# Patient Record
Sex: Male | Born: 1965 | Race: Black or African American | Hispanic: No | Marital: Married | Smoking: Never smoker
Health system: Southern US, Community
[De-identification: ages and names within clinical notes are randomized; demographics above are authoritative.]

## PROBLEM LIST (undated history)

## (undated) DIAGNOSIS — R0602 Shortness of breath: Secondary | ICD-10-CM

## (undated) DIAGNOSIS — J302 Other seasonal allergic rhinitis: Secondary | ICD-10-CM

## (undated) DIAGNOSIS — M1990 Unspecified osteoarthritis, unspecified site: Secondary | ICD-10-CM

## (undated) DIAGNOSIS — I499 Cardiac arrhythmia, unspecified: Secondary | ICD-10-CM

## (undated) DIAGNOSIS — K219 Gastro-esophageal reflux disease without esophagitis: Secondary | ICD-10-CM

## (undated) HISTORY — PX: KNEE ARTHROSCOPY: SUR90

## (undated) HISTORY — PX: LIGAMENT REPAIR: SHX5444

## (undated) HISTORY — PX: COLONOSCOPY W/ POLYPECTOMY: SHX1380

---

## 2010-03-23 HISTORY — PX: LUMBAR LAMINECTOMY: SHX95

## 2013-08-18 ENCOUNTER — Other Ambulatory Visit: Payer: Self-pay | Admitting: Neurological Surgery

## 2013-09-05 ENCOUNTER — Encounter (HOSPITAL_COMMUNITY): Payer: Self-pay | Admitting: Pharmacy Technician

## 2013-09-06 NOTE — Pre-Procedure Instructions (Signed)
Terry CouncilmanBarry Steele  09/06/2013   Your procedure is scheduled on:  Monday, June 29  [facility] at 0[facility] AM.  Call this number if you have problems the morning of surgery: [phone]   Remember:   Do not eat food or drink liquids after midnight.Sunday night   Take these medicines the morning of surgery with A SIP OF WATER: gabapentin, Omeprazole (Prilosec),Pain medication if needed.   Do not wear jewelry.  Do not wear lotions, powders, or perfumes. Do not wear deodorant.  Do not shave 48 hours prior to surgery. Men may shave face and neck.  Do not bring valuables to the hospital.  [facility] is not responsible for any belongings or valuables.               Contacts, dentures or bridgework may not be worn into surgery.  Leave suitcase in the car. After surgery it may be brought to your room.  For patients admitted to the hospital, discharge time is determined by your   treatment team.      Special Instructions: [facility] - Preparing for Surgery  Before surgery, you can play an important role.  Because skin is not sterile, your skin needs to be as free of germs as possible.  You can reduce the number of germs on you skin by washing with CHG (chlorahexidine gluconate) soap before surgery.  CHG is an antiseptic cleaner which kills germs and bonds with the skin to continue killing germs even after washing.  Please DO NOT use if you have an allergy to CHG or antibacterial soaps.  If your skin becomes reddened/irritated stop using the CHG and inform your nurse when you arrive at Short Stay.  Do not shave (including legs and underarms) for at least 48 hours prior to the first CHG shower.  You may shave your face.  Please follow these instructions carefully:   1.  Shower with CHG Soap the night before surgery and the    morning of Surgery.  2.  If you choose to wash your hair, wash your hair first as usual with your   normal shampoo.  3.  After you shampoo, rinse  your hair and body thoroughly to remove the Shampoo.  4.  Use CHG as you would any other liquid soap.  You can apply chg directly    to the skin and wash gently with scrungie or a clean washcloth.  5.  Apply the CHG Soap to your body ONLY FROM THE NECK DOWN.    Do not use on open wounds or open sores.  Avoid contact with your eyes,   ears, mouth and genitals (private parts).  Wash genitals (private parts)    with your normal soap.  6.  Wash thoroughly, paying special attention to the area where your surgery   will be performed.  7.  Thoroughly rinse your body with warm water from the neck down.  8.  DO NOT shower/wash with your normal soap after using and rinsing off  the CHG Soap.  9.  Pat yourself dry with a clean towel.            10.  Wear clean pajamas.            11 .  Place clean sheets on your bed the night of your first shower and do not   sleep with pets.  Day of Surgery  Do not apply any lotions/deoderants the morning of surgery.  Please  wear clean clothes to the hospital/surgery center.     Please read over the following fact sheets that you were given: Pain Booklet, Coughing and Deep Breathing, Blood Transfusion Information and Surgical Site Infection Prevention

## 2013-09-07 ENCOUNTER — Encounter (HOSPITAL_COMMUNITY): Payer: Self-pay

## 2013-09-07 ENCOUNTER — Encounter (HOSPITAL_COMMUNITY)
Admission: RE | Admit: 2013-09-07 | Discharge: 2013-09-07 | Disposition: A | Discharge status: Home / Self Care | Payer: BC Managed Care – PPO | Source: Ambulatory Visit | Attending: Neurological Surgery | Admitting: Neurological Surgery

## 2013-09-07 DIAGNOSIS — Z01812 Encounter for preprocedural laboratory examination: Secondary | ICD-10-CM | POA: Insufficient documentation

## 2013-09-07 HISTORY — DX: Other seasonal allergic rhinitis: J30.2

## 2013-09-07 HISTORY — DX: Shortness of breath: R06.02

## 2013-09-07 HISTORY — DX: Cardiac arrhythmia, unspecified: I49.9

## 2013-09-07 HISTORY — DX: Gastro-esophageal reflux disease without esophagitis: K21.9

## 2013-09-07 HISTORY — DX: Unspecified osteoarthritis, unspecified site: M19.90

## 2013-09-07 LAB — BASIC METABOLIC PANEL
BUN: 11 mg/dL (ref 6–23)
CHLORIDE: 100 meq/L (ref 96–112)
CO2: 29 meq/L (ref 19–32)
CREATININE: 1.15 mg/dL (ref 0.50–1.35)
Calcium: 10 mg/dL (ref 8.4–10.5)
GFR calc non Af Amer: 73 mL/min — ABNORMAL LOW (ref >90)
GFR, EST AFRICAN AMERICAN: 85 mL/min — AB (ref >90)
Glucose, Bld: 88 mg/dL (ref 70–99)
POTASSIUM: 4.7 meq/L (ref 3.7–5.3)
SODIUM: 140 meq/L (ref 137–147)

## 2013-09-07 LAB — CBC
HEMATOCRIT: 45.1 % (ref 39.0–52.0)
Hemoglobin: 15.7 g/dL (ref 13.0–17.0)
MCH: 32.6 pg (ref 26.0–34.0)
MCHC: 34.8 g/dL (ref 30.0–36.0)
MCV: 93.6 fL (ref 78.0–100.0)
Platelets: 239 10*3/uL (ref 150–400)
RBC: 4.82 MIL/uL (ref 4.22–5.81)
RDW: 11.7 % (ref 11.5–15.5)
WBC: 5 10*3/uL (ref 4.0–10.5)

## 2013-09-07 LAB — TYPE AND SCREEN
ABO/RH(D): O POS
ANTIBODY SCREEN: NEGATIVE

## 2013-09-07 LAB — SURGICAL PCR SCREEN
MRSA, PCR: NEGATIVE
STAPHYLOCOCCUS AUREUS: NEGATIVE

## 2013-09-07 LAB — ABO/RH: ABO/RH(D): O POS

## 2013-09-07 NOTE — Progress Notes (Signed)
Patient's PCP is Dr Ralene Okoy Moreira, does not see a cardiologist, denies chest pain or SOB.

## 2013-09-07 NOTE — Pre-Procedure Instructions (Signed)
Terry CouncilmanBarry Steele  09/07/2013   Your procedure is scheduled on:  Monday, June 29  [facility] at 0800 AM.  Call this number if you have problems the morning of surgery: [phone]   Remember:   Do not eat food or drink liquids after midnight.Sunday night   Take these medicines the morning of surgery with A SIP OF WATER: gabapentin, Omeprazole (Prilosec),Pain medication if needed.             Use Albuterol inhaler if needed and bring it to the hospital with you.             Stop taking Aspirin, Coumadin, Plavix, Effient and Herbal medications.  Do not take any NSAIDs ie: Ibuprofen,  Advil,Naproxen or any medication containing Aspirin.   Do not wear jewelry.  Do not wear lotions, powders, or perfumes. Do not wear deodorant.  Do not shave 48 hours prior to surgery. Men may shave face and neck.  Do not bring valuables to the hospital.  [facility] is not responsible for any belongings or valuables.               Contacts, dentures or bridgework may not be worn into surgery.  Leave suitcase in the car. After surgery it may be brought to your room.  For patients admitted to the hospital, discharge time is determined by your   treatment team.        Please read over the following fact sheets that you were given: Pain Booklet, Coughing and Deep Breathing, Blood Transfusion Information and Surgical Site Infection Prevention

## 2013-09-17 MED ORDER — CEFAZOLIN SODIUM-DEXTROSE 2-3 GM-% IV SOLR
2.0000 g | INTRAVENOUS | Status: AC
  Administered 2013-09-18: 2 g via INTRAVENOUS
  Filled 2013-09-17: qty 50

## 2013-09-18 ENCOUNTER — Encounter (HOSPITAL_COMMUNITY)
Admission: RE | Disposition: A | Discharge status: Home / Self Care | Payer: BC Managed Care – PPO | Source: Ambulatory Visit | Attending: Neurological Surgery

## 2013-09-18 ENCOUNTER — Inpatient Hospital Stay (HOSPITAL_COMMUNITY): Payer: BC Managed Care – PPO | Admitting: Anesthesiology

## 2013-09-18 ENCOUNTER — Encounter (HOSPITAL_COMMUNITY): Payer: BC Managed Care – PPO | Admitting: Anesthesiology

## 2013-09-18 ENCOUNTER — Inpatient Hospital Stay (HOSPITAL_COMMUNITY)
Admission: RE | Admit: 2013-09-18 | Discharge: 2013-09-20 | DRG: 460 | Disposition: A | Discharge status: Home / Self Care | Payer: BC Managed Care – PPO | Source: Ambulatory Visit | Attending: Neurological Surgery | Admitting: Neurological Surgery

## 2013-09-18 ENCOUNTER — Inpatient Hospital Stay (HOSPITAL_COMMUNITY): Payer: BC Managed Care – PPO

## 2013-09-18 ENCOUNTER — Encounter (HOSPITAL_COMMUNITY): Payer: Self-pay | Admitting: *Deleted

## 2013-09-18 DIAGNOSIS — K219 Gastro-esophageal reflux disease without esophagitis: Secondary | ICD-10-CM | POA: Diagnosis present

## 2013-09-18 DIAGNOSIS — IMO0002 Reserved for concepts with insufficient information to code with codable children: Principal | ICD-10-CM | POA: Diagnosis present

## 2013-09-18 DIAGNOSIS — M48061 Spinal stenosis, lumbar region without neurogenic claudication: Secondary | ICD-10-CM | POA: Diagnosis present

## 2013-09-18 DIAGNOSIS — Z79899 Other long term (current) drug therapy: Secondary | ICD-10-CM

## 2013-09-18 SURGERY — POSTERIOR LUMBAR FUSION 1 LEVEL
Anesthesia: General | Site: Spine Lumbar

## 2013-09-18 MED ORDER — OXYCODONE HCL 5 MG PO TABS
10.0000 mg | ORAL_TABLET | ORAL | Status: DC | PRN
Start: 2013-09-18 — End: 2013-09-20

## 2013-09-18 MED ORDER — KETOROLAC TROMETHAMINE 30 MG/ML IJ SOLN
INTRAMUSCULAR | Status: AC
  Filled 2013-09-18: qty 1

## 2013-09-18 MED ORDER — LIDOCAINE HCL (CARDIAC) 20 MG/ML IV SOLN
INTRAVENOUS | Status: DC | PRN
  Administered 2013-09-18: 100 mg via INTRAVENOUS

## 2013-09-18 MED ORDER — PHENYLEPHRINE HCL 10 MG/ML IJ SOLN
INTRAMUSCULAR | Status: DC | PRN
Start: 2013-09-18 — End: 2013-09-18
  Administered 2013-09-18 (×3): 80 ug via INTRAVENOUS

## 2013-09-18 MED ORDER — PHENYLEPHRINE 40 MCG/ML (10ML) SYRINGE FOR IV PUSH (FOR BLOOD PRESSURE SUPPORT)
PREFILLED_SYRINGE | INTRAVENOUS | Status: AC
  Filled 2013-09-18: qty 10

## 2013-09-18 MED ORDER — GLYCOPYRROLATE 0.2 MG/ML IJ SOLN
INTRAMUSCULAR | Status: DC | PRN
  Administered 2013-09-18: 0.6 mg via INTRAVENOUS

## 2013-09-18 MED ORDER — NEOSTIGMINE METHYLSULFATE 10 MG/10ML IV SOLN
INTRAVENOUS | Status: AC
  Filled 2013-09-18: qty 1

## 2013-09-18 MED ORDER — EPHEDRINE SULFATE 50 MG/ML IJ SOLN
INTRAMUSCULAR | Status: DC | PRN
  Administered 2013-09-18: 10 mg via INTRAVENOUS

## 2013-09-18 MED ORDER — LIDOCAINE-EPINEPHRINE 1 %-1:100000 IJ SOLN
INTRAMUSCULAR | Status: DC | PRN
  Administered 2013-09-18: 5 mL

## 2013-09-18 MED ORDER — GABAPENTIN 300 MG PO CAPS
300.0000 mg | ORAL_CAPSULE | Freq: Three times a day (TID) | ORAL | Status: DC | PRN
  Filled 2013-09-18: qty 1

## 2013-09-18 MED ORDER — NEOSTIGMINE METHYLSULFATE 10 MG/10ML IV SOLN
INTRAVENOUS | Status: DC | PRN
  Administered 2013-09-18: 4 mg via INTRAVENOUS

## 2013-09-18 MED ORDER — METHOCARBAMOL 500 MG PO TABS
500.0000 mg | ORAL_TABLET | Freq: Four times a day (QID) | ORAL | Status: DC | PRN
  Administered 2013-09-18 – 2013-09-20 (×7): 500 mg via ORAL
  Filled 2013-09-18 (×6): qty 1

## 2013-09-18 MED ORDER — SODIUM CHLORIDE 0.9 % IV SOLN
INTRAVENOUS | Status: DC | PRN
Start: 2013-09-18 — End: 2013-09-18
  Administered 2013-09-18: 11:00:00 via INTRAVENOUS

## 2013-09-18 MED ORDER — ALBUTEROL SULFATE HFA 108 (90 BASE) MCG/ACT IN AERS: 1.0000 | INHALATION_SPRAY | Freq: Four times a day (QID) | RESPIRATORY_TRACT | Status: DC | PRN

## 2013-09-18 MED ORDER — ONDANSETRON HCL 4 MG/2ML IJ SOLN
INTRAMUSCULAR | Status: DC | PRN
  Administered 2013-09-18: 4 mg via INTRAVENOUS

## 2013-09-18 MED ORDER — METHOCARBAMOL 500 MG PO TABS
ORAL_TABLET | ORAL | Status: AC
  Filled 2013-09-18: qty 1

## 2013-09-18 MED ORDER — OXYCODONE-ACETAMINOPHEN 5-325 MG PO TABS
1.0000 | ORAL_TABLET | ORAL | Status: DC | PRN
Start: 2013-09-18 — End: 2013-09-20
  Administered 2013-09-18 – 2013-09-20 (×10): 2 via ORAL
  Filled 2013-09-18 (×10): qty 2

## 2013-09-18 MED ORDER — SODIUM CHLORIDE 0.9 % IJ SOLN
3.0000 mL | Freq: Two times a day (BID) | INTRAMUSCULAR | Status: DC
Start: 2013-09-18 — End: 2013-09-20
  Administered 2013-09-18: 3 mL via INTRAVENOUS

## 2013-09-18 MED ORDER — BUPIVACAINE HCL (PF) 0.5 % IJ SOLN
INTRAMUSCULAR | Status: DC | PRN
  Administered 2013-09-18: 5 mL

## 2013-09-18 MED ORDER — BSS IO SOLN: 15.0000 mL | Freq: Once | INTRAOCULAR | Status: DC

## 2013-09-18 MED ORDER — MENTHOL 3 MG MT LOZG: 1.0000 | LOZENGE | OROMUCOSAL | Status: DC | PRN

## 2013-09-18 MED ORDER — THROMBIN 20000 UNITS EX SOLR
CUTANEOUS | Status: DC | PRN
  Administered 2013-09-18: 09:00:00 via TOPICAL

## 2013-09-18 MED ORDER — DEXAMETHASONE SODIUM PHOSPHATE 10 MG/ML IJ SOLN
INTRAMUSCULAR | Status: DC | PRN
  Administered 2013-09-18: 10 mg via INTRAVENOUS

## 2013-09-18 MED ORDER — ARTIFICIAL TEARS OP OINT
TOPICAL_OINTMENT | OPHTHALMIC | Status: AC
  Filled 2013-09-18: qty 3.5

## 2013-09-18 MED ORDER — HYDROMORPHONE HCL PF 1 MG/ML IJ SOLN
INTRAMUSCULAR | Status: AC
  Filled 2013-09-18: qty 1

## 2013-09-18 MED ORDER — GLYCOPYRROLATE 0.2 MG/ML IJ SOLN
INTRAMUSCULAR | Status: AC
  Filled 2013-09-18: qty 3

## 2013-09-18 MED ORDER — LORATADINE 10 MG PO TABS
10.0000 mg | ORAL_TABLET | Freq: Every day | ORAL | Status: DC | PRN
  Filled 2013-09-18: qty 1

## 2013-09-18 MED ORDER — OXYCODONE HCL 5 MG PO TABS
5.0000 mg | ORAL_TABLET | Freq: Once | ORAL | Status: AC | PRN
  Administered 2013-09-18: 5 mg via ORAL

## 2013-09-18 MED ORDER — POLYETHYLENE GLYCOL 3350 17 G PO PACK
17.0000 g | PACK | Freq: Every day | ORAL | Status: DC | PRN
  Filled 2013-09-18: qty 1

## 2013-09-18 MED ORDER — SODIUM CHLORIDE 0.9 % IN NEBU
INHALATION_SOLUTION | RESPIRATORY_TRACT | Status: AC
  Filled 2013-09-18: qty 3

## 2013-09-18 MED ORDER — MIDAZOLAM HCL 2 MG/2ML IJ SOLN
INTRAMUSCULAR | Status: AC
  Filled 2013-09-18: qty 2

## 2013-09-18 MED ORDER — ROCURONIUM BROMIDE 100 MG/10ML IV SOLN
INTRAVENOUS | Status: DC | PRN
  Administered 2013-09-18: 50 mg via INTRAVENOUS
  Administered 2013-09-18 (×2): 10 mg via INTRAVENOUS

## 2013-09-18 MED ORDER — KETOROLAC TROMETHAMINE 0.5 % OP SOLN
1.0000 [drp] | Freq: Three times a day (TID) | OPHTHALMIC | Status: AC | PRN
  Administered 2013-09-18: 1 [drp] via OPHTHALMIC
  Filled 2013-09-18: qty 3

## 2013-09-18 MED ORDER — DOCUSATE SODIUM 100 MG PO CAPS
100.0000 mg | ORAL_CAPSULE | Freq: Two times a day (BID) | ORAL | Status: DC
  Administered 2013-09-18 – 2013-09-20 (×4): 100 mg via ORAL
  Filled 2013-09-18 (×5): qty 1

## 2013-09-18 MED ORDER — OXYCODONE HCL 5 MG/5ML PO SOLN: 5.0000 mg | Freq: Once | ORAL | Status: AC | PRN

## 2013-09-18 MED ORDER — ROCURONIUM BROMIDE 50 MG/5ML IV SOLN
INTRAVENOUS | Status: AC
  Filled 2013-09-18: qty 1

## 2013-09-18 MED ORDER — SODIUM CHLORIDE 0.9 % IR SOLN
Status: DC | PRN
  Administered 2013-09-18: 09:00:00

## 2013-09-18 MED ORDER — MIDAZOLAM HCL 5 MG/5ML IJ SOLN
INTRAMUSCULAR | Status: DC | PRN
  Administered 2013-09-18: 2 mg via INTRAVENOUS

## 2013-09-18 MED ORDER — PANTOPRAZOLE SODIUM 40 MG PO TBEC
40.0000 mg | DELAYED_RELEASE_TABLET | Freq: Every day | ORAL | Status: DC
  Administered 2013-09-19 – 2013-09-20 (×2): 40 mg via ORAL
  Filled 2013-09-18 (×2): qty 1

## 2013-09-18 MED ORDER — BISACODYL 10 MG RE SUPP: 10.0000 mg | Freq: Every day | RECTAL | Status: DC | PRN

## 2013-09-18 MED ORDER — ONDANSETRON HCL 4 MG/2ML IJ SOLN
INTRAMUSCULAR | Status: AC
  Filled 2013-09-18: qty 2

## 2013-09-18 MED ORDER — PHENOL 1.4 % MT LIQD: 1.0000 | OROMUCOSAL | Status: DC | PRN

## 2013-09-18 MED ORDER — FENTANYL CITRATE 0.05 MG/ML IJ SOLN
INTRAMUSCULAR | Status: AC
  Filled 2013-09-18: qty 5

## 2013-09-18 MED ORDER — FENTANYL CITRATE 0.05 MG/ML IJ SOLN
INTRAMUSCULAR | Status: DC | PRN
  Administered 2013-09-18: 100 ug via INTRAVENOUS
  Administered 2013-09-18: 150 ug via INTRAVENOUS

## 2013-09-18 MED ORDER — ALUM & MAG HYDROXIDE-SIMETH 200-200-20 MG/5ML PO SUSP: 30.0000 mL | Freq: Four times a day (QID) | ORAL | Status: DC | PRN

## 2013-09-18 MED ORDER — OXYCODONE HCL 5 MG PO TABS
ORAL_TABLET | ORAL | Status: AC
  Filled 2013-09-18: qty 1

## 2013-09-18 MED ORDER — LACTATED RINGERS IV SOLN
INTRAVENOUS | Status: DC | PRN
  Administered 2013-09-18 (×2): via INTRAVENOUS

## 2013-09-18 MED ORDER — KETOROLAC TROMETHAMINE 15 MG/ML IJ SOLN
15.0000 mg | Freq: Four times a day (QID) | INTRAMUSCULAR | Status: AC
  Administered 2013-09-18 – 2013-09-19 (×4): 15 mg via INTRAVENOUS
  Filled 2013-09-18 (×4): qty 1

## 2013-09-18 MED ORDER — SODIUM CHLORIDE 0.9 % IJ SOLN
INTRAMUSCULAR | Status: AC
  Filled 2013-09-18: qty 10

## 2013-09-18 MED ORDER — PROPOFOL 10 MG/ML IV BOLUS
INTRAVENOUS | Status: DC | PRN
  Administered 2013-09-18: 200 mg via INTRAVENOUS

## 2013-09-18 MED ORDER — LIDOCAINE HCL (CARDIAC) 20 MG/ML IV SOLN
INTRAVENOUS | Status: AC
  Filled 2013-09-18: qty 5

## 2013-09-18 MED ORDER — ALBUTEROL SULFATE (2.5 MG/3ML) 0.083% IN NEBU: 2.5000 mg | INHALATION_SOLUTION | Freq: Four times a day (QID) | RESPIRATORY_TRACT | Status: DC | PRN

## 2013-09-18 MED ORDER — SODIUM CHLORIDE 0.9 % IV SOLN: INTRAVENOUS | Status: DC

## 2013-09-18 MED ORDER — ACETAMINOPHEN 325 MG PO TABS: 650.0000 mg | ORAL_TABLET | ORAL | Status: DC | PRN

## 2013-09-18 MED ORDER — ONDANSETRON HCL 4 MG/2ML IJ SOLN: 4.0000 mg | INTRAMUSCULAR | Status: DC | PRN

## 2013-09-18 MED ORDER — LATANOPROST 0.005 % OP SOLN
1.0000 [drp] | Freq: Every day | OPHTHALMIC | Status: DC
  Administered 2013-09-18 – 2013-09-19 (×2): 1 [drp] via OPHTHALMIC
  Filled 2013-09-18: qty 2.5

## 2013-09-18 MED ORDER — SENNA 8.6 MG PO TABS
1.0000 | ORAL_TABLET | Freq: Two times a day (BID) | ORAL | Status: DC
  Administered 2013-09-18 – 2013-09-20 (×4): 8.6 mg via ORAL
  Filled 2013-09-18 (×5): qty 1

## 2013-09-18 MED ORDER — FLEET ENEMA 7-19 GM/118ML RE ENEM
1.0000 | ENEMA | Freq: Once | RECTAL | Status: AC | PRN
  Filled 2013-09-18: qty 1

## 2013-09-18 MED ORDER — EPHEDRINE SULFATE 50 MG/ML IJ SOLN
INTRAMUSCULAR | Status: AC
  Filled 2013-09-18: qty 1

## 2013-09-18 MED ORDER — BECLOMETHASONE DIPROPIONATE 80 MCG/ACT NA AERS: 1.0000 | INHALATION_SPRAY | Freq: Every day | NASAL | Status: DC | PRN

## 2013-09-18 MED ORDER — PROPOFOL 10 MG/ML IV BOLUS
INTRAVENOUS | Status: AC
  Filled 2013-09-18: qty 20

## 2013-09-18 MED ORDER — SODIUM CHLORIDE 0.9 % IV SOLN: 250.0000 mL | INTRAVENOUS | Status: DC

## 2013-09-18 MED ORDER — HYDROMORPHONE HCL PF 1 MG/ML IJ SOLN
0.2500 mg | INTRAMUSCULAR | Status: DC | PRN
  Administered 2013-09-18: 0.25 mg via INTRAVENOUS
  Administered 2013-09-18: 0.5 mg via INTRAVENOUS
  Administered 2013-09-18: 0.25 mg via INTRAVENOUS

## 2013-09-18 MED ORDER — SODIUM CHLORIDE 0.9 % IJ SOLN: 3.0000 mL | INTRAMUSCULAR | Status: DC | PRN

## 2013-09-18 MED ORDER — METHOCARBAMOL 1000 MG/10ML IJ SOLN
500.0000 mg | Freq: Four times a day (QID) | INTRAVENOUS | Status: DC | PRN
  Filled 2013-09-18: qty 5

## 2013-09-18 MED ORDER — FLUTICASONE PROPIONATE 50 MCG/ACT NA SUSP: 1.0000 | Freq: Every day | NASAL | Status: DC | PRN

## 2013-09-18 MED ORDER — 0.9 % SODIUM CHLORIDE (POUR BTL) OPTIME
TOPICAL | Status: DC | PRN
  Administered 2013-09-18: 1000 mL

## 2013-09-18 MED ORDER — KETOROLAC TROMETHAMINE 0.5 % OP SOLN: 1.0000 [drp] | Freq: Three times a day (TID) | OPHTHALMIC | Status: DC | PRN

## 2013-09-18 MED ORDER — MORPHINE SULFATE 2 MG/ML IJ SOLN: 1.0000 mg | INTRAMUSCULAR | Status: DC | PRN

## 2013-09-18 MED ORDER — ACETAMINOPHEN 650 MG RE SUPP
650.0000 mg | RECTAL | Status: DC | PRN
Start: 2013-09-18 — End: 2013-09-20

## 2013-09-18 MED ORDER — PROMETHAZINE HCL 25 MG/ML IJ SOLN: 6.2500 mg | INTRAMUSCULAR | Status: DC | PRN

## 2013-09-18 MED ORDER — ARTIFICIAL TEARS OP OINT
TOPICAL_OINTMENT | OPHTHALMIC | Status: DC | PRN
  Administered 2013-09-18: 1 via OPHTHALMIC

## 2013-09-18 SURGICAL SUPPLY — 70 items
BAG DECANTER FOR FLEXI CONT (MISCELLANEOUS) ×2 IMPLANT
BLADE 10 SAFETY STRL DISP (BLADE) IMPLANT
BLADE SURG ROTATE 9660 (MISCELLANEOUS) IMPLANT
BONE MATRIX OSTEOCEL PRO MED (Bone Implant) ×4 IMPLANT
BUR MATCHSTICK NEURO 3.0 LAGG (BURR) ×2 IMPLANT
CAGE PLIF 8X9X23-12 LUMBAR (Cage) ×4 IMPLANT
CANISTER SUCT 3000ML (MISCELLANEOUS) ×2 IMPLANT
CONT SPEC 4OZ CLIKSEAL STRL BL (MISCELLANEOUS) ×4 IMPLANT
COVER BACK TABLE 24X17X13 BIG (DRAPES) IMPLANT
COVER TABLE BACK 60X90 (DRAPES) ×2 IMPLANT
DECANTER SPIKE VIAL GLASS SM (MISCELLANEOUS) ×2 IMPLANT
DERMABOND ADHESIVE PROPEN (GAUZE/BANDAGES/DRESSINGS) ×1
DERMABOND ADVANCED (GAUZE/BANDAGES/DRESSINGS)
DERMABOND ADVANCED .7 DNX12 (GAUZE/BANDAGES/DRESSINGS) IMPLANT
DERMABOND ADVANCED .7 DNX6 (GAUZE/BANDAGES/DRESSINGS) ×1 IMPLANT
DRAPE C-ARM 42X72 X-RAY (DRAPES) ×4 IMPLANT
DRAPE LAPAROTOMY 100X72X124 (DRAPES) ×2 IMPLANT
DRAPE POUCH INSTRU U-SHP 10X18 (DRAPES) ×2 IMPLANT
DRAPE PROXIMA HALF (DRAPES) IMPLANT
DRSG OPSITE POSTOP 4X6 (GAUZE/BANDAGES/DRESSINGS) ×2 IMPLANT
DURAPREP 26ML APPLICATOR (WOUND CARE) ×2 IMPLANT
ELECT REM PT RETURN 9FT ADLT (ELECTROSURGICAL) ×2
ELECTRODE REM PT RTRN 9FT ADLT (ELECTROSURGICAL) ×1 IMPLANT
GAUZE SPONGE 4X4 16PLY XRAY LF (GAUZE/BANDAGES/DRESSINGS) IMPLANT
GLOVE BIOGEL PI IND STRL 7.0 (GLOVE) ×1 IMPLANT
GLOVE BIOGEL PI IND STRL 7.5 (GLOVE) ×2 IMPLANT
GLOVE BIOGEL PI IND STRL 8.5 (GLOVE) ×2 IMPLANT
GLOVE BIOGEL PI INDICATOR 7.0 (GLOVE) ×1
GLOVE BIOGEL PI INDICATOR 7.5 (GLOVE) ×2
GLOVE BIOGEL PI INDICATOR 8.5 (GLOVE) ×2
GLOVE ECLIPSE 7.0 STRL STRAW (GLOVE) ×2 IMPLANT
GLOVE ECLIPSE 8.5 STRL (GLOVE) ×4 IMPLANT
GLOVE EXAM NITRILE LRG STRL (GLOVE) IMPLANT
GLOVE EXAM NITRILE MD LF STRL (GLOVE) IMPLANT
GLOVE EXAM NITRILE XL STR (GLOVE) IMPLANT
GLOVE EXAM NITRILE XS STR PU (GLOVE) IMPLANT
GLOVE SURG SS PI 7.0 STRL IVOR (GLOVE) ×4 IMPLANT
GOWN STRL REUS W/ TWL LRG LVL3 (GOWN DISPOSABLE) ×2 IMPLANT
GOWN STRL REUS W/ TWL XL LVL3 (GOWN DISPOSABLE) IMPLANT
GOWN STRL REUS W/TWL 2XL LVL3 (GOWN DISPOSABLE) ×4 IMPLANT
GOWN STRL REUS W/TWL LRG LVL3 (GOWN DISPOSABLE) ×2
GOWN STRL REUS W/TWL XL LVL3 (GOWN DISPOSABLE)
HEMOSTAT POWDER KIT SURGIFOAM (HEMOSTASIS) ×2 IMPLANT
KIT BASIN OR (CUSTOM PROCEDURE TRAY) ×2 IMPLANT
KIT ROOM TURNOVER OR (KITS) ×2 IMPLANT
NEEDLE HYPO 22GX1.5 SAFETY (NEEDLE) ×2 IMPLANT
NS IRRIG 1000ML POUR BTL (IV SOLUTION) ×2 IMPLANT
PACK LAMINECTOMY NEURO (CUSTOM PROCEDURE TRAY) ×2 IMPLANT
PAD ARMBOARD 7.5X6 YLW CONV (MISCELLANEOUS) ×10 IMPLANT
PATTIES SURGICAL .5 X1 (DISPOSABLE) ×2 IMPLANT
ROD REBENT 35MM (Rod) ×4 IMPLANT
SCREW LOCK (Screw) ×4 IMPLANT
SCREW LOCK 100X5.5X OPN (Screw) ×4 IMPLANT
SCREW POLY 45X6.5 (Screw) ×4 IMPLANT
SCREW POLY 6.5X45MM (Screw) ×4 IMPLANT
SPONGE GAUZE 4X4 12PLY (GAUZE/BANDAGES/DRESSINGS) IMPLANT
SPONGE LAP 4X18 X RAY DECT (DISPOSABLE) IMPLANT
SPONGE SURGIFOAM ABS GEL 100 (HEMOSTASIS) ×2 IMPLANT
SUT VIC AB 1 CT1 18XBRD ANBCTR (SUTURE) ×2 IMPLANT
SUT VIC AB 1 CT1 8-18 (SUTURE) ×2
SUT VIC AB 2-0 CP2 18 (SUTURE) ×4 IMPLANT
SUT VIC AB 3-0 SH 8-18 (SUTURE) ×4 IMPLANT
SYR 20ML ECCENTRIC (SYRINGE) ×2 IMPLANT
SYR 3ML LL SCALE MARK (SYRINGE) ×8 IMPLANT
TOWEL OR 17X24 6PK STRL BLUE (TOWEL DISPOSABLE) ×2 IMPLANT
TOWEL OR 17X26 10 PK STRL BLUE (TOWEL DISPOSABLE) ×2 IMPLANT
TRAP SPECIMEN MUCOUS 40CC (MISCELLANEOUS) ×2 IMPLANT
TRAY FOLEY CATH 14FRSI W/METER (CATHETERS) IMPLANT
TRAY FOLEY CATH 16FRSI W/METER (SET/KITS/TRAYS/PACK) ×2 IMPLANT
WATER STERILE IRR 1000ML POUR (IV SOLUTION) ×2 IMPLANT

## 2013-09-18 NOTE — Op Note (Signed)
Date of surgery: 09/18/2013 Preoperative diagnosis: L2-L3 spondylosis, retrolisthesis, stenosis, with chronic back pain and lumbar radiculopathy, neurogenic claudication Postoperative diagnosis: L2-L3 spondylosis, retrolisthesis, stenosis, herniated nucleus pulposus, with chronic back pain and lumbar radiculopathy, neurogenic claudication Procedure: Decompression L2-L3, total discectomy with more work than require for simple interbody arthrodesis. Posterior lumbar interbody arthrodesis using peek spacers local autograft and allograft. Pedicle screw fixation L2-L3 with posterior lateral arthrodesis using local autograft and allograft. (Osteo cell) Surgeon: Barnett AbuHenry Kaleiah Kutzer First assistant: Lisbeth RenshawNeelesh Nundkumar Anesthesia: General endotracheal Indications: Mr. Terry Steele is a 48 year old individual who several years ago underwent surgical decompression at L2-L3 for a severe stenosis with a centrally herniated nucleus pulposus. The had minimal relief. He has had chronic back pain. Repeat studies demonstrate that he has advanced degenerative changes at L2-L3 with retrolisthesis and possibly herniated nucleus pulposus on the left side at that level. He has responded transiently to intradiscal injections but did not respond ALT epidural steroid injections and lumbar spine. Having failed all efforts at conservative management he was advised regarding surgical decompression and stabilization.  Procedure: Patient was brought to the operating room supine on a stretcher. After the smooth induction of general endotracheal anesthesia, he was carefully turned prone. The bony prominences were appropriately padded and protected. A midline of his back was then prepped with alcohol and DuraPrep and draped in a sterile fashion. An elliptical incision was made around the previous scar which was thick and ropey. This piece of skin was excised. Dissection was carried down to the lumbar dorsal fascia. The defect of the laminectomy could  easily be identified. The lumbar fascia was opened in the midline down to the epidural space over the laminectomy defect itself. Care was taken to preserve the integrity of the dural tube. Care was taken to dissect superiorly into the L2 foramen and decompress this nerve of substantial lateral recess stenosis. This was done with a combination of a high-speed drill and 2 mm Kerrison punch. On the opposite side there was noted to be significant bulging of the disc posteriorly. This was decompressed and the path of the L3 nerve root was cleared inferiorly. A foraminotomy for the L2 and nerve root superiorly was also created. Soft tissue was carefully dissected from the bony edge preserving the integrity of the dural tube. On the left side it was noted that there was substantial evidence of herniated nuclear material in the epidural space elevating and compressing the left side of the common dural tube at the take off of the L3 nerve root. This was decompressed. Ultimately a complete discectomy at L2-L3 was performed. Interbody spacers were then used to size the interspace. It is felt that an 8 mm tall 12 lordotic spacer measuring 23 mm in length with the best to allow restoration of the normal anatomic architecture at L2-L3. The endplates were curettaged to prepare them for grafting and once all the disc material was removed from the interspace, the spacers were placed on the left and on the right with a total of 12 cc of a mixture of autograft and Osteo cell being packed into the interspace.  At this point the lateral gutters were decorticated in the intertransverse space. Pedicle entry sites were chosen using fluoroscopic guidance. 6.5 x 45 mm screws were placed in both L2 and L3 under fluoroscopic guidance. Each individual hole was first sounded then tapped and then checked for any cutout of the screws. 35 mm precontoured rods were then placed between the screws. The superior screws were tightened and  then  compression was placed to add lordosis at L2-L3. The surgical construct was tightened. Final radiographs were obtained in AP and lateral projections. This demonstrated the reconstruction of lordosis and good alignment in both the coronal and sagittal planes. The lateral gutters were then packed with the remainder of the autograft and allograft.  Hemostasis was obtained in the soft tissues. Blood loss for the procedure was estimated at 500 cc. 160 cc of Cell Saver blood was returned to the patient. The lumbar dorsal fascia was closed with #1 Vicryl, 2-0 Vicryl was used in the subcutaneous tissues, 3-0 Vicryl subcuticularly. Dermabond was placed on the skin. Patient tolerated the procedure was returned to recovery room in stable condition.

## 2013-09-18 NOTE — Transfer of Care (Signed)
Immediate Anesthesia Transfer of Care Note  Patient: Terry CouncilmanBarry Steele  Procedure(s) Performed: Procedure(s): LUMBAR TWO-THREE POSTERIOR LUMBAR INTERBODY FUSION (N/A)  Patient Location: PACU  Anesthesia Type:General  Level of Consciousness: awake, alert , oriented and sedated  Airway & Oxygen Therapy: Patient Spontanous Breathing and Patient connected to nasal cannula oxygen  Post-op Assessment: Report given to PACU RN, Post -op Vital signs reviewed and stable and Patient moving all extremities  Post vital signs: Reviewed and stable  Complications: No apparent anesthesia complications

## 2013-09-18 NOTE — H&P (Signed)
Terry Steele is an 48 y.o. male.   Chief Complaint: Back and bilateral leg pain HPI: Terry Steele is a 48 year old individual who had undergone surgical decompression for significant stenosis at L2-L3 several years ago. This was in relation to [employer] injury where he had significant central herniation of the disc at L2-L3. Surgery was done by Dr. Sharolyn DouglasMax Cohen. Postoperatively he continued to have substantial problems with back pain. He underwent extensive conservative management. Despite this he has had persistence of back pain now with recurrence of radicular symptoms into the anterior thighs and also into the lower extremities. He responded well to an intradiscal steroid injection which gave him substantial relief for a period of about 3 months injection was repeated in the same effect was noted. His been advised regarding the need for surgical decompression and stabilization of this joint. The rest of his lumbar spine appears to be quite healthy.  Past Medical History  Diagnosis Date  . Seasonal allergies   . Shortness of breath     with allergies  . GERD (gastroesophageal reflux disease)   . Arthritis     back  . Dysrhythmia     Past Surgical History  Procedure Laterality Date  . Ligament repair Left     thumb  . Knee arthroscopy Right   . Lumbar laminectomy  2012  . Colonoscopy w/ polypectomy      History reviewed. No pertinent family history. Social History:  reports that he has never smoked. He does not have any smokeless tobacco history on file. He reports that he does not drink alcohol or use illicit drugs.  Allergies: No Known Allergies  Medications Prior to Admission  Medication Sig Dispense Refill  . albuterol (PROVENTIL HFA;VENTOLIN HFA) 108 (90 BASE) MCG/ACT inhaler Inhale 1-2 puffs into the lungs every 6 (six) hours as needed for wheezing or shortness of breath.      . Beclomethasone Dipropionate (QNASL) 80 MCG/ACT AERS Place 1 spray into both nostrils daily as  needed (for allergies).      . fluticasone (FLONASE) 50 MCG/ACT nasal spray Place 1 spray into both nostrils daily as needed for allergies or rhinitis.      Marland Kitchen. gabapentin (NEURONTIN) 300 MG capsule Take 300 mg by mouth 3 (three) times daily as needed (for pain).      Marland Kitchen. ibuprofen (ADVIL,MOTRIN) 800 MG tablet Take 800 mg by mouth every 8 (eight) hours as needed for mild pain.      Marland Kitchen. loratadine (CLARITIN) 10 MG tablet Take 10 mg by mouth daily as needed for allergies.      . Multiple Vitamins-Minerals (MULTIVITAMIN PO) Take 1 tablet by mouth daily.      . Omega-3 Fatty Acids (FISH OIL) 1200 MG CAPS Take 1,200 mg by mouth daily.      Marland Kitchen. omeprazole (PRILOSEC) 20 MG capsule Take 20 mg by mouth daily.      . tapentadol (NUCYNTA) 50 MG TABS tablet Take 50 mg by mouth every 6 (six) hours as needed for moderate pain or severe pain.      . Travoprost, BAK Free, (TRAVATAN) 0.004 % SOLN ophthalmic solution Place 1 drop into both eyes at bedtime.      Marland Kitchen. L-Arginine 1000 MG TABS Take by mouth.      . saw palmetto 160 MG capsule Take 320 mg by mouth 2 (two) times daily.        No results found for this or any previous visit (from the past 48 hour(s)). No  results found.  Review of Systems  HENT: Negative.   Eyes: Negative.   Respiratory: Negative.   Cardiovascular: Negative.   Gastrointestinal: Negative.   Genitourinary: Negative.   Musculoskeletal: Positive for back pain.  Neurological: Positive for tremors and weakness.  Endo/Heme/Allergies: Negative.   Psychiatric/Behavioral: Negative.     Blood pressure 164/93, pulse 83, temperature 98.2 F (36.8 C), temperature source Oral, resp. rate 20, weight 110.678 kg (244 lb), SpO2 99.00%. Physical Exam  Constitutional: He appears well-developed and well-nourished.  HENT:  Head: Normocephalic and atraumatic.  Eyes: Conjunctivae and EOM are normal. Pupils are equal, round, and reactive to light.  Neck: Normal range of motion. Neck supple.  Cardiovascular:  Normal rate and regular rhythm.   Respiratory: Effort normal and breath sounds normal.  GI: Soft. Bowel sounds are normal.  Musculoskeletal:  Moderate centralized back pain to palpation and percussion. Range of motion limited flexing toward 45. Extension limited only 5.  Neurological:  Modest weakness in quadriceps tibialis anterior graded at 4+ out of 5 bilaterally. Absent patellar and Achilles reflexes bilaterally.  Skin: Skin is warm.  Psychiatric: He has a normal mood and affect. His behavior is normal. Judgment and thought content normal.     Assessment/Plan Spondylosis and retrolisthesis of L2 on L3 with stenosis. Neurogenic claudication, or radiculopathy. Surgical decompression L2-L3 with stabilization using posterior interbody arthrodesis peek spacers pedicle screw fixation L2-L3.  ELSNER,HENRY J 09/18/2013, 7:49 AM

## 2013-09-18 NOTE — Anesthesia Preprocedure Evaluation (Signed)
Anesthesia Evaluation  Patient identified by MRN, date of birth, ID band Patient awake    Reviewed: Allergy & Precautions, H&P , NPO status , Patient's Chart, lab work & pertinent test results  Airway Mallampati: I TM Distance: >3 FB Neck ROM: full    Dental  (+) Teeth Intact, Dental Advidsory Given   Pulmonary neg pulmonary ROS,  breath sounds clear to auscultation        Cardiovascular negative cardio ROS  Rhythm:regular Rate:Normal     Neuro/Psych negative neurological ROS  negative psych ROS   GI/Hepatic Neg liver ROS, GERD-  ,  Endo/Other  negative endocrine ROS  Renal/GU negative Renal ROS     Musculoskeletal   Abdominal   Peds  Hematology   Anesthesia Other Findings   Reproductive/Obstetrics negative OB ROS                           Anesthesia Physical Anesthesia Plan  ASA: II  Anesthesia Plan: General ETT   Post-op Pain Management:    Induction:   Airway Management Planned:   Additional Equipment:   Intra-op Plan:   Post-operative Plan:   Informed Consent: I have reviewed the patients History and Physical, chart, labs and discussed the procedure including the risks, benefits and alternatives for the proposed anesthesia with the patient or authorized representative who has indicated his/her understanding and acceptance.   Dental Advisory Given  Plan Discussed with: Anesthesiologist, CRNA and Surgeon  Anesthesia Plan Comments:         Anesthesia Quick Evaluation

## 2013-09-18 NOTE — Progress Notes (Signed)
Patient ID: Terry CouncilmanBarry Avans, male   DOB: 09/25/1965, 48 y.o.   MRN: [id number]  awake alert oriented postop. Dressing is clean dry Motor function appears intact in lower extremities Modest back soreness Stable postop.

## 2013-09-18 NOTE — Anesthesia Postprocedure Evaluation (Signed)
Anesthesia Post Note  Patient: Terry Steele  Procedure(s) Performed: Procedure(s) (LRB): LUMBAR TWO-THREE POSTERIOR LUMBAR INTERBODY FUSION (N/A)  Anesthesia type: general  Patient location: PACU  Post pain: Pain level controlled  Post assessment: Patient's Cardiovascular Status Stable  Last Vitals:  Filed Vitals:   09/18/13 1325  BP: 144/86  Pulse: 95  Temp: 37.2 C  Resp: 18    Post vital signs: Reviewed and stable  Level of consciousness: sedated  Complications: Possible corneal abraision

## 2013-09-18 NOTE — Anesthesia Procedure Notes (Addendum)
Procedure Name: Intubation Date/Time: 09/18/2013 8:06 AM Performed by: Fransisca KaufmannMEYER, MARY E Pre-anesthesia Checklist: Patient identified, Emergency Drugs available, Suction available, Patient being monitored and Timeout performed Patient Re-evaluated:Patient Re-evaluated prior to inductionOxygen Delivery Method: Circle system utilized Preoxygenation: Pre-oxygenation with 100% oxygen Intubation Type: IV induction Ventilation: Mask ventilation without difficulty Laryngoscope Size: Miller and 3 Grade View: Grade I Tube type: Oral Tube size: 8.0 mm Number of attempts: 1 Airway Equipment and Method: Stylet Placement Confirmation: ETT inserted through vocal cords under direct vision,  positive ETCO2 and breath sounds checked- equal and bilateral Secured at: 23 cm Tube secured with: Tape Dental Injury: Teeth and Oropharynx as per pre-operative assessment

## 2013-09-19 LAB — COMPREHENSIVE METABOLIC PANEL
ALK PHOS: 45 U/L (ref 39–117)
ALT: 42 U/L (ref 0–53)
AST: 29 U/L (ref 0–37)
Albumin: 3.2 g/dL — ABNORMAL LOW (ref 3.5–5.2)
BILIRUBIN TOTAL: 0.4 mg/dL (ref 0.3–1.2)
BUN: 13 mg/dL (ref 6–23)
CHLORIDE: 101 meq/L (ref 96–112)
CO2: 27 meq/L (ref 19–32)
CREATININE: 1.16 mg/dL (ref 0.50–1.35)
Calcium: 8.8 mg/dL (ref 8.4–10.5)
GFR calc Af Amer: 84 mL/min — ABNORMAL LOW (ref >90)
GFR, EST NON AFRICAN AMERICAN: 72 mL/min — AB (ref >90)
Glucose, Bld: 116 mg/dL — ABNORMAL HIGH (ref 70–99)
POTASSIUM: 4.5 meq/L (ref 3.7–5.3)
Sodium: 140 mEq/L (ref 137–147)
Total Protein: 6.4 g/dL (ref 6.0–8.3)

## 2013-09-19 LAB — CBC
HEMATOCRIT: 41 % (ref 39.0–52.0)
HEMOGLOBIN: 13.6 g/dL (ref 13.0–17.0)
MCH: 31.9 pg (ref 26.0–34.0)
MCHC: 33.2 g/dL (ref 30.0–36.0)
MCV: 96 fL (ref 78.0–100.0)
Platelets: 219 10*3/uL (ref 150–400)
RBC: 4.27 MIL/uL (ref 4.22–5.81)
RDW: 12.1 % (ref 11.5–15.5)
WBC: 13 10*3/uL — ABNORMAL HIGH (ref 4.0–10.5)

## 2013-09-19 MED FILL — Heparin Sodium (Porcine) Inj 1000 Unit/ML: INTRAMUSCULAR | Qty: 30 | Status: AC

## 2013-09-19 MED FILL — Sodium Chloride IV Soln 0.9%: INTRAVENOUS | Qty: 2000 | Status: AC

## 2013-09-19 NOTE — Evaluation (Signed)
Physical Therapy Evaluation Patient Details Name: Terry Steele MRN: [id number] DOB: 03/02/1966 Today's Date: 09/19/2013   History of Present Illness  Pt admitted for L2-3 PLIF  Clinical Impression  Pt very pleasant and educated and precautions with transfers, ADLs, gait, stairs and daily mobility. Pt and wife verbalize understanding with handout provided. Pt reports no pain or numbness other than incisional pain 4/10 and will benefit from acute therapy and mobility to maximize independence adhering to precautions.     Follow Up Recommendations No PT follow up    Equipment Recommendations  3in1 (PT)    Recommendations for Other Services       Precautions / Restrictions Precautions Precautions: Back Precaution Booklet Issued: Yes (comment)      Mobility  Bed Mobility Overal bed mobility: Needs Assistance Bed Mobility: Rolling;Sidelying to Sit;Sit to Sidelying Rolling: Supervision Sidelying to sit: Supervision     Sit to sidelying: Supervision General bed mobility comments: cues for sequence to maintain precautions  Transfers Overall transfer level: Needs assistance   Transfers: Sit to/from Stand Sit to Stand: Supervision         General transfer comment: cues for sequence and precautions  Ambulation/Gait Ambulation/Gait assistance: Modified independent (Device/Increase time) Ambulation Distance (Feet): 400 Feet Assistive device: None Gait Pattern/deviations: Step-through pattern;Decreased stride length   Gait velocity interpretation: Below normal speed for age/gender    Stairs Stairs: Yes Stairs assistance: Modified independent (Device/Increase time) Stair Management: One rail Left;Step to pattern;Forwards Number of Stairs: 11 General stair comments: cues for sequence  Wheelchair Mobility    Modified Rankin (Stroke Patients Only)       Balance                                             Pertinent Vitals/Pain     Home Living  Family/patient expects to be discharged to:: Private residence Living Arrangements: Spouse/significant other Available Help at Discharge: Family Type of Home: House Home Access: Stairs to enter   Secretary/administratorntrance Stairs-Number of Steps: 2 Home Layout: Two level;Bed/bath upstairs Home Equipment: None      Prior Function Level of Independence: Independent               Hand Dominance        Extremity/Trunk Assessment   Upper Extremity Assessment: Overall WFL for tasks assessed           Lower Extremity Assessment: Overall WFL for tasks assessed      Cervical / Trunk Assessment: Normal  Communication   Communication: No difficulties  Cognition Arousal/Alertness: Awake/alert Behavior During Therapy: WFL for tasks assessed/performed Overall Cognitive Status: Within Functional Limits for tasks assessed                      General Comments      Exercises        Assessment/Plan    PT Assessment Patient needs continued PT services  PT Diagnosis Acute pain;Difficulty walking   PT Problem List Decreased activity tolerance;Decreased mobility;Decreased knowledge of use of DME;Pain  PT Treatment Interventions Gait training;Functional mobility training;Therapeutic activities;Patient/family education   PT Goals (Current goals can be found in the Care Plan section) Acute Rehab PT Goals Patient Stated Goal: be able to return to work at [employer] PT Goal Formulation: With patient/family Time For Goal Achievement: 09/26/13 Potential to Achieve Goals: Good    Frequency Min  5X/week   Barriers to discharge        Co-evaluation               End of Session Equipment Utilized During Treatment: Back brace Activity Tolerance: Patient tolerated treatment well Patient left: in chair;with call bell/phone within reach;with family/visitor present Nurse Communication: Mobility status;Precautions         Time: [id number] PT Time Calculation (min): 27  min   Charges:   PT Evaluation $Initial PT Evaluation Tier I: 1 Procedure PT Treatments $Therapeutic Activity: 8-22 mins   PT G CodesDelorse Lek:          Tabor, Maija Beth 09/19/2013, 9:08 AM  Delaney MeigsMaija Tabor Prater, PT [phone]

## 2013-09-19 NOTE — Progress Notes (Signed)
Patient ID: Terry CouncilmanBarry Steele, male   DOB: 11/03/1965, 48 y.o.   MRN: [id number] Vital signs are stable. Patient having minimal back pain. Some buttock pain bilaterally. Incision is clean and dry Motor function is intact in lower extremities Patient is voiding spontaneously Pain under reasonable control but still requiring parenteral pain medication. Consider for discharge tomorrow.

## 2013-09-19 NOTE — Evaluation (Signed)
Occupational Therapy Evaluation Patient Details Name: Terry Steele MRN: [id number] DOB: 02/21/1966 Today's Date: 09/19/2013    History of Present Illness Pt admitted for L2-3 PLIF   Clinical Impression   Pt educated in ADL and IADL adhering to back precautions.  Instructed in multiple uses of 3 in 1 and availability of AE for LB bathing and dressing.  Pt will have supervision and assist of his wife at home.  No further OT needs.    Follow Up Recommendations  No OT follow up    Equipment Recommendations  3 in 1 bedside comode    Recommendations for Other Services       Precautions / Restrictions Precautions Precautions: Back Precaution Booklet Issued: Yes (comment) Precaution Comments: reviewed precautions related to ADL and ADL transfers Restrictions Weight Bearing Restrictions: No      Mobility Bed Mobility  Transfers Overall transfer level: Needs assistance   Transfers: Sit to/from Stand Sit to Stand: Supervision         General transfer comment: cues for sequence and precautions    Balance                                            ADL Overall ADL's : Needs assistance/impaired Eating/Feeding: Independent;Sitting   Grooming: Supervision/safety;Standing Grooming Details (indicate cue type and reason): pt instructed in use of 2 cups for toothbrushing Upper Body Bathing: Supervision/ safety;Standing   Lower Body Bathing: Supervison/ safety;With adaptive equipment (standing) Lower Body Bathing Details (indicate cue type and reason): recommended long handled bath sponge Upper Body Dressing : Set up;Sitting   Lower Body Dressing: Minimal assistance;Sit to/from stand Lower Body Dressing Details (indicate cue type and reason): instructed in availability of AE for LB dressing and adaptive techniques to adhere to back precautions Toilet Transfer: Supervision/safety;Ambulation Toilet Transfer Details (indicate cue type and reason): instructed in  use of 3 in 1 as shower seat if pt chooses to sit to shower Toileting- Clothing Manipulation and Hygiene: Modified independent Toileting - Clothing Manipulation Details (indicate cue type and reason): instructed to avoid twisting with pericare following BM     Functional mobility during ADLs: Supervision/safety General ADL Comments: Pt will rely on his wife for assistance with LB bathing and dressing.     Vision                     Perception     Praxis      Pertinent Vitals/Pain 4/10 back, premedicated     Hand Dominance Right   Extremity/Trunk Assessment Upper Extremity Assessment Upper Extremity Assessment: Overall WFL for tasks assessed   Lower Extremity Assessment Lower Extremity Assessment: Overall WFL for tasks assessed   Cervical / Trunk Assessment Cervical / Trunk Assessment: Normal   Communication Communication Communication: No difficulties   Cognition Arousal/Alertness: Awake/alert Behavior During Therapy: WFL for tasks assessed/performed Overall Cognitive Status: Within Functional Limits for tasks assessed                     General Comments       Exercises       Shoulder Instructions      Home Living Family/patient expects to be discharged to:: Private residence Living Arrangements: Spouse/significant other Available Help at Discharge: Family;Available 24 hours/day Type of Home: House Home Access: Stairs to enter [employer] CorporationEntrance Stairs-Number of Steps: 2   Home Layout: Two  level;Bed/bath upstairs Alternate Level Stairs-Number of Steps: 15 Alternate Level Stairs-Rails: Right;Left Bathroom Shower/Tub: Producer, television/film/videoWalk-in shower   Bathroom Toilet: Standard     Home Equipment: None          Prior Functioning/Environment Level of Independence: Independent             OT Diagnosis:     OT Problem List:     OT Treatment/Interventions:      OT Goals(Current goals can be found in the care plan section) Acute Rehab OT Goals Patient  Stated Goal: be able to return to work at [employer]  OT Frequency:     Barriers to D/C:            Co-evaluation              End of Session    Activity Tolerance: Patient tolerated treatment well Patient left: in chair;with call bell/phone within reach;with family/visitor present   Time: [id number] OT Time Calculation (min): 11 min Charges:  OT General Charges $OT Visit: 1 Procedure OT Evaluation $Initial OT Evaluation Tier I: 1 Procedure G-Codes:    Evern BioMayberry, Julie Lynn 09/19/2013, 9:15 AM [phone]

## 2013-09-20 MED ORDER — METHOCARBAMOL 500 MG PO TABS: 500.0000 mg | ORAL_TABLET | Freq: Four times a day (QID) | ORAL | Status: DC | PRN

## 2013-09-20 MED ORDER — OXYCODONE-ACETAMINOPHEN 5-325 MG PO TABS: 1.0000 | ORAL_TABLET | ORAL | Status: DC | PRN

## 2013-09-20 NOTE — Progress Notes (Signed)
Physical Therapy Treatment Patient Details Name: Geryl CouncilmanBarry Jablonowski MRN: [id number] DOB: 03/12/1966 Today's Date: 09/20/2013    History of Present Illness Pt admitted for L2-3 PLIF    PT Comments    Pt progressing well with mobility able to perform transfers without assist or cueing. Pt unable to recall all precautions at beginning of session and educated, pt with increased gait and incisional pain. Pt moving well with all education completed, safe for D/C.  Follow Up Recommendations  No PT follow up     Equipment Recommendations       Recommendations for Other Services       Precautions / Restrictions Precautions Precautions: Back Precaution Booklet Issued: Yes (comment) Precaution Comments: reviewed precautions related to ADL, gait, transfers Required Braces or Orthoses: Spinal Brace Spinal Brace: Lumbar corset;Applied in sitting position    Mobility  Bed Mobility Overal bed mobility: Modified Independent                Transfers Overall transfer level: Modified independent                  Ambulation/Gait Ambulation/Gait assistance: Modified independent (Device/Increase time) Ambulation Distance (Feet): 600 Feet Assistive device: None Gait Pattern/deviations: WFL(Within Functional Limits)         Stairs            Wheelchair Mobility    Modified Rankin (Stroke Patients Only)       Balance                                    Cognition Arousal/Alertness: Awake/alert Behavior During Therapy: WFL for tasks assessed/performed Overall Cognitive Status: Within Functional Limits for tasks assessed                      Exercises      General Comments        Pertinent Vitals/Pain 4/10 incisional pain, repositioned, premedicated    Home Living                      Prior Function            PT Goals (current goals can now be found in the care plan section) Progress towards PT goals: Progressing toward  goals    Frequency       PT Plan Current plan remains appropriate    Co-evaluation             End of Session Equipment Utilized During Treatment: Back brace Activity Tolerance: Patient tolerated treatment well Patient left: in chair;with call bell/phone within reach     Time: 0730-0744 PT Time Calculation (min): 14 min  Charges:  $Therapeutic Activity: 8-22 mins                    G Codes:      Delorse Lekabor, Coyle Stordahl Beth 09/20/2013, 8:31 AM Delaney MeigsMaija Tabor Lakayla Barrington, PT [phone]

## 2013-09-20 NOTE — Discharge Summary (Signed)
Physician Discharge Summary  Patient ID: Diogenes Bansal Geryl CouncilmanMRN: [id number] DOB/AGE: 49/09/1965 49 y.o.  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  Discharge Exam: Blood pressure 143/77, pulse 94, temperature 98 F (36.7 C), temperature source Oral, resp. rate 18, weight 110.678 kg (244 lb), SpO2 95.00%. neuro normal, incision clean and dry.  Disposition: discharge home  Discharge Instructions   Call MD for:  redness, tenderness, or signs of infection (pain, swelling, redness, odor or green/yellow discharge around incision site)    Complete by:  As directed      Call MD for:  severe uncontrolled pain    Complete by:  As directed      Call MD for:  temperature >100.4    Complete by:  As directed      Diet - low sodium heart healthy    Complete by:  As directed      Discharge instructions    Complete by:  As directed   Okay to shower. Do not apply salves or appointments to incision. No heavy lifting with the upper extremities greater than 15 pounds. May resume driving when not requiring pain medication and patient feels comfortable with doing so.     Increase activity slowly    Complete by:  As directed             Medication List         albuterol 108 (90 BASE) MCG/ACT inhaler  Commonly known as:  PROVENTIL HFA;VENTOLIN HFA  Inhale 1-2 puffs into the lungs every 6 (six) hours as needed for wheezing or shortness of breath.     Fish Oil 1200 MG Caps  Take 1,200 mg by mouth daily.     fluticasone 50 MCG/ACT nasal spray  Commonly known as:  FLONASE  Place 1 spray into both  nostrils daily as needed for allergies or rhinitis.     gabapentin 300 MG capsule  Commonly known as:  NEURONTIN  Take 300 mg by mouth 3 (three) times daily as needed (for pain).     ibuprofen 800 MG tablet  Commonly known as:  ADVIL,MOTRIN  Take 800 mg by mouth every 8 (eight) hours as needed for mild pain.     L-Arginine 1000 MG Tabs  Take by mouth.     loratadine 10 MG tablet  Commonly known as:  CLARITIN  Take 10 mg by mouth daily as needed for allergies.     methocarbamol 500 MG tablet  Commonly known as:  ROBAXIN  Take 1 tablet (500 mg total) by mouth every 6 (six) hours as needed for muscle spasms.     MULTIVITAMIN PO  Take 1 tablet by mouth daily.     omeprazole 20 MG capsule  Commonly known as:  PRILOSEC  Take 20 mg by mouth daily.     oxyCODONE-acetaminophen 5-325 MG per tablet  Commonly known as:  PERCOCET/ROXICET  Take 1-2 tablets by mouth every 4 (four) hours as needed for moderate pain.     QNASL 80 MCG/ACT Aers  Generic drug:  Beclomethasone Dipropionate  Place 1 spray into both nostrils daily as needed (for allergies).     saw palmetto 160 MG capsule  Take 320 mg  by mouth 2 (two) times daily.     tapentadol 50 MG Tabs tablet  Commonly known as:  NUCYNTA  Take 50 mg by mouth every 6 (six) hours as needed for moderate pain or severe pain.     Travoprost (BAK Free) 0.004 % Soln ophthalmic solution  Commonly known as:  TRAVATAN  Place 1 drop into both eyes at bedtime.         SignedStefani Dama: Cullen Vanallen J 09/20/2013, 9:03 AM

## 2013-09-20 NOTE — Progress Notes (Signed)
Pt doing well. Pt and wife given D/C instructions with Rx's, verbal understanding was given. Pt's IV was removed prior to D/C. Pt D/C'd home with corsett brace per MD order. Pt D/C'd home via wheelchair @ 1050 per MD order. Pt is stable @ D/C and has no other needs at this time. Rema FendtAshley Kaylor Simenson, RN

## 2013-09-20 NOTE — Discharge Instructions (Signed)

## 2015-09-20 IMAGING — CR DG LUMBAR SPINE 1V
1 series · 1 of 1 positions shown · non-contrast
Comparison: MRI scan of July 26, 2013.

CLINICAL DATA: The L2-3 posterior laminectomy and fusion.

EXAM:
LUMBAR SPINE - 1 VIEW

[lat]
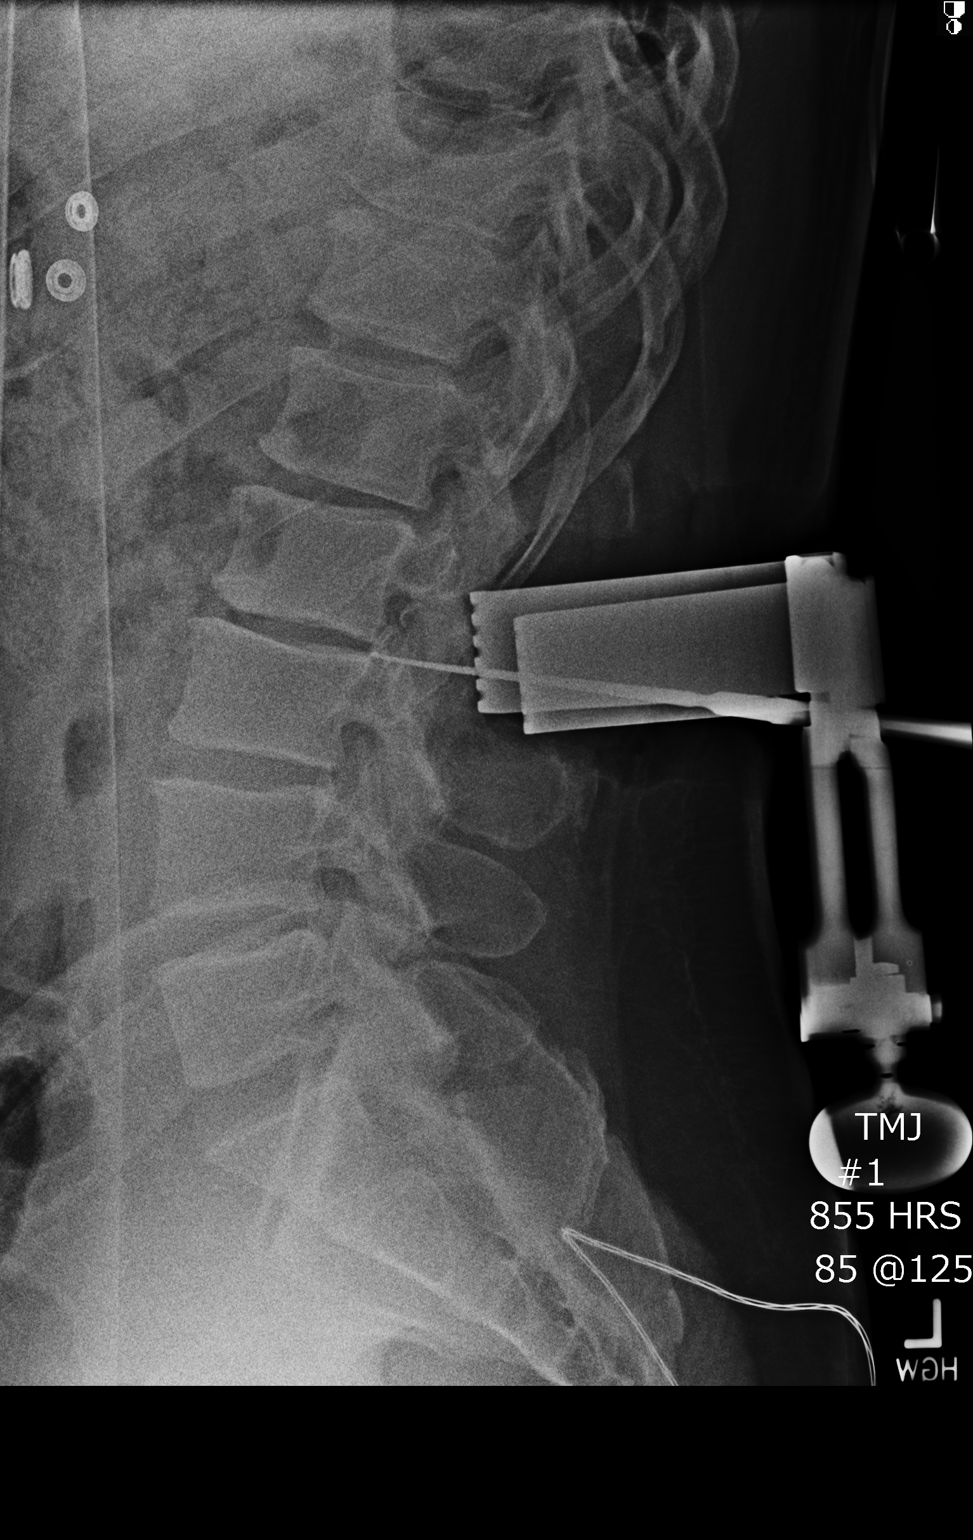

[1 of 1 positions shown; findings below may reference images not displayed]

FINDINGS: Single lateral intraoperative view of the lumbar spine demonstrates
surgical probe directed at posterior margin of L2-3 disc space.
Surgical retractor is seen in posterior soft tissues.
IMPRESSION: See above.

## 2017-10-29 ENCOUNTER — Inpatient Hospital Stay: Admission: RE | Admit: 2017-10-29 | Payer: Self-pay | Source: Ambulatory Visit

## 2017-11-05 ENCOUNTER — Ambulatory Visit
Admission: RE | Admit: 2017-11-05 | Payer: BLUE CROSS/BLUE SHIELD | Source: Ambulatory Visit | Admitting: General Surgery

## 2017-11-05 ENCOUNTER — Encounter: Admission: RE | Payer: Self-pay | Source: Ambulatory Visit

## 2017-11-05 SURGERY — REPAIR, HERNIA, INGUINAL, BILATERAL, LAPAROSCOPIC
Anesthesia: Choice | Laterality: Bilateral

## 2018-07-18 ENCOUNTER — Other Ambulatory Visit: Payer: Self-pay

## 2018-07-18 ENCOUNTER — Encounter: Payer: Self-pay | Admitting: Allergy and Immunology

## 2018-07-18 ENCOUNTER — Ambulatory Visit: Payer: BLUE CROSS/BLUE SHIELD | Admitting: Allergy and Immunology

## 2018-07-18 VITALS — BP 160/90 | HR 72 | Temp 98.6°F | Resp 16 | Ht 73.0 in | Wt 240.6 lb

## 2018-07-18 DIAGNOSIS — J3089 Other allergic rhinitis: Secondary | ICD-10-CM

## 2018-07-18 DIAGNOSIS — K219 Gastro-esophageal reflux disease without esophagitis: Secondary | ICD-10-CM | POA: Diagnosis not present

## 2018-07-18 DIAGNOSIS — J301 Allergic rhinitis due to pollen: Secondary | ICD-10-CM

## 2018-07-18 MED ORDER — MONTELUKAST SODIUM 10 MG PO TABS: ORAL_TABLET | ORAL | 5 refills | Status: DC

## 2018-07-18 MED ORDER — OMEPRAZOLE 40 MG PO CPDR: DELAYED_RELEASE_CAPSULE | ORAL | 5 refills | Status: DC

## 2018-07-18 MED ORDER — TRIAMCINOLONE ACETONIDE 55 MCG/ACT NA AERO: INHALATION_SPRAY | NASAL | 5 refills | Status: DC

## 2018-07-18 NOTE — Patient Instructions (Addendum)
  1.  Allergen avoidance measures  2.  Treat and prevent inflammation:   A.  OTC Nasacort-1 spray each nostril daily  B.  Montelukast 10 mg - 1 tablet daily  3.  Treat and prevent reflux:   A.  Omeprazole 40 mg - 1 tablet twice a day  B.  Minimize caffeine as much as possible  4.  If needed:   A.  Cetirizine 10 mg - 1 tablet 1 time per day  B.  Nasal saline  5.  Return to clinic in 4 weeks or earlier if problem

## 2018-07-18 NOTE — Progress Notes (Signed)
[place] [facility] [place] [facility] [place] [facility] [place] [facility] [place]   NEW PATIENT NOTE  Referring Provider: No ref. provider found Primary Provider: Ralene Ok, MD Date of office visit: 07/18/2018    Subjective:   Chief Complaint:  Terry Steele (DOB: [birth year]) is a 53 y.o. male who presents to the clinic on 07/18/2018 with a chief complaint of Allergic Rhinitis  and Throat drainage .  HPI: Terry Steele presents to this clinic in evaluation of problems with allergies.  I had apparently seen him in this clinic over 5 years ago for similar issue.  He has some problems with nasal congestion and feeling as though he cannot clear out his nose and possibly some slight sneezing but no ugly nasal discharge and no anosmia.  In conjunction with this issue he has had postnasal drip and throat clearing and feeling as though there is something stuck in his throat and some left ear fullness especially at nighttime.  He has been given some Norel which apparently does help this issue somewhat but certainly does not result in resolution of this issue.  Apparently this has been an on and off issue but it has been particularly bad this past spring.  Sometimes he feels as though his chest is congested and he cannot get a good breath.  He does not really describe any chest tightness or wheezing or coughing or chest pain with this issue.  Someone gave him a sample of albuterol which did not help him at all.  He does not have any symptoms of reflux at this point in time.  He does drink 1 coffee per day.  Past Medical History:  Diagnosis Date  . Arthritis    back  . Dysrhythmia   . GERD (gastroesophageal reflux disease)   . Seasonal allergies   . Shortness of breath    with allergies    Past Surgical History:  Procedure Laterality Date  . COLONOSCOPY W/ POLYPECTOMY    . KNEE ARTHROSCOPY Right   . LIGAMENT REPAIR Left    thumb  . LUMBAR LAMINECTOMY  2012    Allergies as of 07/18/2018   No Known Allergies      Medication List      albuterol 108 (90 Base) MCG/ACT inhaler Commonly known as:  VENTOLIN HFA Inhale 1-2 puffs into the lungs every 6 (six) hours as needed for wheezing or shortness of breath.   ALFALFA PO Take by mouth.   Fish Oil 1200 MG Caps Take 1,200 mg by mouth daily.   Lumigan 0.01 % Soln Generic drug:  bimatoprost PLACE 1 DROP INTO BOTH EYES NIGHTLY.   meloxicam 15 MG tablet Commonly known as:  MOBIC Take by mouth.   methocarbamol 500 MG tablet Commonly known as:  ROBAXIN Take 1 tablet (500 mg total) by mouth every 6 (six) hours as needed for muscle spasms.   MULTIVITAMIN PO Take 1 tablet by mouth daily.   Norel AD 4-10-325 MG Tabs Generic drug:  Chlorphen-PE-Acetaminophen Take 2 tablets by mouth as needed.   timolol 0.5 % ophthalmic solution Commonly known as:  TIMOPTIC Place 1 drop into both eyes 2 times daily.       Review of systems negative except as noted in HPI / PMHx or noted below:  Review of Systems  Constitutional: Negative.   HENT: Negative.   Eyes: Negative.   Respiratory: Negative.   Cardiovascular: Negative.   Gastrointestinal: Negative.   Genitourinary: Negative.   Musculoskeletal: Negative.   Skin: Negative.  Neurological: Negative.   Endo/Heme/Allergies: Negative.   Psychiatric/Behavioral: Negative.     History reviewed. No pertinent family history.  Social History   Socioeconomic History  . Marital status: Married    Spouse name: Not on file  . Number of children: Not on file  . Years of education: Not on file  . Highest education level: Not on file  Occupational History  . Not on file  Social Needs  . Financial resource strain: Not on file  . Food insecurity:    Worry: Not on file    Inability: Not on file  . Transportation needs:    Medical: Not on file    Non-medical: Not on file  Tobacco Use  . Smoking status: Never Smoker  . Smokeless tobacco: Never Used  Substance and Sexual Activity  . Alcohol use: No   . Drug use: No  . Sexual activity: Not on file  Lifestyle  . Physical activity:    Days per week: Not on file    Minutes per session: Not on file  . Stress: Not on file  Relationships  . Social connections:    Talks on phone: Not on file    Gets together: Not on file    Attends religious service: Not on file    Active member of club or organization: Not on file    Attends meetings of clubs or organizations: Not on file    Relationship status: Not on file  . Intimate partner violence:    Fear of current or ex partner: Not on file    Emotionally abused: Not on file    Physically abused: Not on file    Forced sexual activity: Not on file  Other Topics Concern  . Not on file  Social History Narrative  . Not on file    Environmental and Social history  Lives in a house with a dry environment, no animals located inside the household, carpet in the bedroom, no plastic on the bed, no plastic on the pillow, and no smokers located to the household.  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  Objective:   Vitals:   07/18/18 0957  BP: (!) 160/90  Pulse: 72  Resp: 16  Temp: 98.6 F (37 C)  SpO2: 97%   Height: 6\' 1"  (185.4 cm) Weight: 240 lb 9.6 oz (109.1 kg)  Physical Exam Constitutional:      Appearance: He is not diaphoretic.  HENT:     Head: Normocephalic. No right periorbital erythema or left periorbital erythema.     Right Ear: Tympanic membrane, ear canal and external ear normal.     Left Ear: Tympanic membrane, ear canal and external ear normal.     Nose: Nose normal. No mucosal edema or rhinorrhea.     Mouth/Throat:     Pharynx: Uvula midline. No oropharyngeal exudate.  Eyes:     General: Lids are normal.     Conjunctiva/sclera: Conjunctivae normal.     Pupils: Pupils are equal, round, and reactive to light.  Neck:     Thyroid: No thyromegaly.     Trachea: Trachea normal. No tracheal tenderness or tracheal deviation.  Cardiovascular:     Rate and Rhythm: Normal rate  and regular rhythm.     Heart sounds: Normal heart sounds, S1 normal and S2 normal. No murmur.  Pulmonary:     Effort: Pulmonary effort is normal. No respiratory distress.     Breath sounds: Normal breath sounds. No stridor. No wheezing or  rales.  Chest:     Chest wall: No tenderness.  Abdominal:     General: There is no distension.     Palpations: Abdomen is soft. There is no mass.     Tenderness: There is no abdominal tenderness. There is no guarding or rebound.  Musculoskeletal:        General: No tenderness.  Lymphadenopathy:     Head:     Right side of head: No tonsillar adenopathy.     Left side of head: No tonsillar adenopathy.     Cervical: No cervical adenopathy.  Skin:    Coloration: Skin is not pale.     Findings: No erythema or rash.     Nails: There is no clubbing.   Neurological:     Mental Status: He is alert.     Diagnostics: Allergy skin tests were performed.  He demonstrated hypersensitivity to trees, grasses, weeds.  Spirometry was performed and demonstrated an FEV1 of 4.0 @ 111 % of predicted. FEV1/FVC = 0.80  Assessment and Plan:    1. Perennial allergic rhinitis   2. LPRD (laryngopharyngeal reflux disease)   3. Seasonal allergic rhinitis due to pollen     1.  Allergen avoidance measures  2.  Treat and prevent inflammation:   A.  OTC Nasacort-1 spray each nostril daily  B.  Montelukast 10 mg - 1 tablet daily  3.  Treat and prevent reflux:   A.  Omeprazole 40 mg - 1 tablet twice a day  B.  Minimize caffeine as much as possible  4.  If needed:   A.  Cetirizine 10 mg - 1 tablet 1 time per day  B.  Nasal saline  5.  Return to clinic in 4 weeks or earlier if problem  I think that very probably has 2 insults to his airway including his atopic respiratory disease and reflux induced respiratory disease and he will address both issues with the therapy noted above and I will see him back in this clinic in 4 weeks to make a determination about  further evaluation and treatment based upon his response.  Laurette SchimkeEric Kozlow, MD Allergy / Immunology [facility]

## 2018-07-19 ENCOUNTER — Encounter: Payer: Self-pay | Admitting: Allergy and Immunology

## 2018-08-18 ENCOUNTER — Ambulatory Visit: Payer: Self-pay | Admitting: Allergy and Immunology

## 2020-08-07 ENCOUNTER — Other Ambulatory Visit: Payer: Self-pay | Admitting: Allergy and Immunology

## 2022-01-29 ENCOUNTER — Ambulatory Visit (INDEPENDENT_AMBULATORY_CARE_PROVIDER_SITE_OTHER): Payer: BC Managed Care – PPO | Admitting: Allergy and Immunology

## 2022-01-29 ENCOUNTER — Encounter: Payer: Self-pay | Admitting: Allergy and Immunology

## 2022-01-29 VITALS — BP 150/88 | HR 97 | Resp 16 | Ht 73.0 in | Wt 239.4 lb

## 2022-01-29 DIAGNOSIS — H6992 Unspecified Eustachian tube disorder, left ear: Secondary | ICD-10-CM | POA: Diagnosis not present

## 2022-01-29 DIAGNOSIS — J988 Other specified respiratory disorders: Secondary | ICD-10-CM

## 2022-01-29 DIAGNOSIS — K219 Gastro-esophageal reflux disease without esophagitis: Secondary | ICD-10-CM

## 2022-01-29 MED ORDER — OMEPRAZOLE 40 MG PO CPDR: 40.0000 mg | DELAYED_RELEASE_CAPSULE | Freq: Two times a day (BID) | ORAL | 5 refills | Status: DC

## 2022-01-29 MED ORDER — FAMOTIDINE 40 MG PO TABS: 40.0000 mg | ORAL_TABLET | Freq: Every evening | ORAL | 5 refills | Status: AC

## 2022-01-29 MED ORDER — METHYLPREDNISOLONE ACETATE 80 MG/ML IJ SUSP
80.0000 mg | Freq: Once | INTRAMUSCULAR | Status: AC
  Administered 2022-01-29: 80 mg via INTRAMUSCULAR

## 2022-01-29 MED ORDER — MONTELUKAST SODIUM 10 MG PO TABS: 10.0000 mg | ORAL_TABLET | Freq: Every day | ORAL | 5 refills | Status: DC

## 2022-01-29 MED ORDER — AZITHROMYCIN 500 MG PO TABS: 500.0000 mg | ORAL_TABLET | Freq: Every day | ORAL | 0 refills | Status: AC

## 2022-01-29 NOTE — Patient Instructions (Addendum)
  1.  Treat inflammation:   A.  Depo-Medrol 80 IM delivered in clinic today  B.  Montelukast 10 mg - 1 tablet 1 time per day  C.  Nasacort or Flonase - 1 spray each nostril 1 time per day  2.  Treat reflux:   A.  Omeprazole 40 mg -2 times per day  B.  Famotidine 40 mg - 1 tablet in p.m.  3.  Treat infection:   A.  Azithromycin 500 mg - 1 tablet 1 time per day for 3 days only  4.  If needed:   A.  OTC antihistamine  B.  OTC Mucinex DM  5.  Further evaluation and treatment???  6.  Obtain flu vaccine December 2023

## 2022-01-29 NOTE — Progress Notes (Signed)
[place] - [place] - [place] - [place] - [place]   Follow-up Note  Referring Provider: Ralene Ok, MD Primary Provider: Ralene Ok, MD Date of Office Visit: 01/29/2022  Subjective:   Terry Steele (DOB: 06/21/1965) is a 56 y.o. male who [facility] in re-evaluation of the following:  HPI: Terry Steele presents to this clinic in evaluation of allergies.  I had last seen him in this clinic on 18 July 2018.  He states that he was really doing very well and intermittently use some antihistamines and decongestants and did not really have any need for other anti-inflammatory agents for his airway and did not treat reflux.  However, approximately 2 weeks ago he started to develop stomach cramps and burping and some regurgitation with belching for about 3 to 4 days quickly followed by lots of postnasal drip, a cough when he exhaled, the sensation of a clogged head, and some left ear popping.  He did not have any shortness of breath or chest tightness or chest pain or phlegm production or ugly nasal discharge or anosmia or hearing loss.  Allergies as of 01/29/2022   No Known Allergies      Medication List      diclofenac 75 MG EC tablet Commonly known as: VOLTAREN Take 75 mg by mouth 2 (two) times daily.   Fish Oil 1200 MG Caps Take 1,200 mg by mouth daily.   Lumigan 0.01 % Soln Generic drug: bimatoprost PLACE 1 DROP INTO BOTH EYES NIGHTLY.   meloxicam 15 MG tablet Commonly known as: MOBIC Take by mouth.   methocarbamol 500 MG tablet Commonly known as: ROBAXIN Take by mouth.   MULTIVITAMIN PO Take 1 tablet by mouth daily.   timolol 0.5 % ophthalmic solution Commonly known as: TIMOPTIC Place 1 drop into both eyes 2 times daily.    Past Medical History:  Diagnosis Date   Arthritis    back   Dysrhythmia    GERD (gastroesophageal reflux disease)    Seasonal allergies    Shortness of breath    with allergies     Past Surgical History:  Procedure Laterality Date   COLONOSCOPY W/ POLYPECTOMY     KNEE ARTHROSCOPY Right    LIGAMENT REPAIR Left    thumb   LUMBAR LAMINECTOMY  2012    Review of systems negative except as noted in HPI / PMHx or noted below:  Review of Systems  Constitutional: Negative.   HENT: Negative.    Eyes: Negative.   Respiratory: Negative.    Cardiovascular: Negative.   Gastrointestinal: Negative.   Genitourinary: Negative.   Musculoskeletal: Negative.   Skin: Negative.   Neurological: Negative.   Endo/Heme/Allergies: Negative.   Psychiatric/Behavioral: Negative.       Objective:   Vitals:   01/29/22 1432  BP: (!) 150/88  Pulse: 97  Resp: 16  SpO2: 96%   Height: 6\' 1"  (185.4 cm)  Weight: 239 lb 6.4 oz (108.6 kg)   Physical Exam Constitutional:      Appearance: He is not diaphoretic.  HENT:     Head: Normocephalic.     Right Ear: Tympanic membrane, ear canal and external ear normal.     Left Ear: Ear canal and external ear normal. A middle ear effusion (Dull tympanic membrane light reflex) is present.     Nose: Nose normal. No mucosal edema or rhinorrhea.     Mouth/Throat:     Pharynx: Uvula midline. No oropharyngeal exudate.  Eyes:     Conjunctiva/sclera: Conjunctivae normal.  Neck:     Thyroid: No thyromegaly.     Trachea: Trachea normal. No tracheal tenderness or tracheal deviation.  Cardiovascular:     Rate and Rhythm: Normal rate and regular rhythm.     Heart sounds: Normal heart sounds, S1 normal and S2 normal. No murmur heard. Pulmonary:     Effort: No respiratory distress.     Breath sounds: Normal breath sounds. No stridor. No wheezing or rales.  Lymphadenopathy:     Head:     Right side of head: No tonsillar adenopathy.     Left side of head: No tonsillar adenopathy.     Cervical: No cervical adenopathy.  Skin:    Findings: No erythema or rash.     Nails: There is no clubbing.  Neurological:     Mental Status: He is alert.      Diagnostics: none  Assessment and Plan:   1. Respiratory tract infection   2. Dysfunction of left eustachian tube   3. Gastroesophageal reflux disease, unspecified whether esophagitis present    1.  Treat inflammation:   A.  Depo-Medrol 80 IM delivered in clinic today  B.  Montelukast 10 mg - 1 tablet 1 time per day  C.  Nasacort or Flonase - 1 spray each nostril 1 time per day  2.  Treat reflux:   A.  Omeprazole 40 mg -2 times per day  B.  Famotidine 40 mg - 1 tablet in p.m.  3.  Treat infection:   A.  Azithromycin 500 mg - 1 tablet 1 time per day for 3 days only  4.  If needed:   A.  OTC antihistamine  B.  OTC Mucinex DM  5.  Further evaluation and treatment???  6.  Obtain flu vaccine December 2023  1 of 2 things actually happened with Terry Steele.  He either had a significant reflux event that gave rise to significant inflammation of his airway or he was infected with some type of pathogen that affected both his respiratory tract and his GI tract.  For now we are going to treat him with anti-inflammatory agents and treat him for reflux and treat him with a broad-spectrum antibiotic and we will see what happens with this approach.  He has done so well for many years with very little medications and I am not that interested in having him undergo extensive evaluation for this issue at this point but certainly if this is a persistent issue he is going to require further evaluation and treatment.  With his sudden onset GI issues we always need to be cognizant about the possibility of an acute Helicobacter pylori infection.  Terry Katz, MD Allergy / Immunology [place]

## 2022-02-02 ENCOUNTER — Encounter: Payer: Self-pay | Admitting: Allergy and Immunology

## 2022-03-21 ENCOUNTER — Other Ambulatory Visit: Payer: Self-pay | Admitting: Allergy and Immunology

## 2022-05-01 ENCOUNTER — Other Ambulatory Visit: Payer: Self-pay | Admitting: Allergy and Immunology

## 2022-06-25 ENCOUNTER — Telehealth: Payer: Self-pay | Admitting: Allergy and Immunology

## 2022-06-25 MED ORDER — PREDNISONE 10 MG PO TABS: ORAL_TABLET | ORAL | 0 refills | Status: AC

## 2022-06-25 NOTE — Telephone Encounter (Signed)
Patient informed of Dr. Gilford Silvius.  [place] sent to [facility].

## 2022-06-25 NOTE — Telephone Encounter (Signed)
Patient states his allergies are flaring up after working out in the yard this past weekend. He is taking his allergy medication but his congestion is very bad and doesn't seem to be getting better. He's been dealing with this since Monday afternoon. He wants to know if there is anything we can send in for him.   Best pharmacy- [facility] in [place]

## 2023-02-14 ENCOUNTER — Other Ambulatory Visit: Payer: Self-pay | Admitting: Allergy and Immunology

## 2023-05-19 ENCOUNTER — Encounter: Payer: Self-pay | Admitting: Allergy and Immunology

## 2023-05-19 ENCOUNTER — Ambulatory Visit: Payer: BC Managed Care – PPO | Admitting: Allergy and Immunology

## 2023-05-19 VITALS — BP 150/90 | HR 106 | Resp 18

## 2023-05-19 DIAGNOSIS — H6993 Unspecified Eustachian tube disorder, bilateral: Secondary | ICD-10-CM | POA: Diagnosis not present

## 2023-05-19 DIAGNOSIS — K219 Gastro-esophageal reflux disease without esophagitis: Secondary | ICD-10-CM | POA: Diagnosis not present

## 2023-05-19 DIAGNOSIS — J3089 Other allergic rhinitis: Secondary | ICD-10-CM | POA: Diagnosis not present

## 2023-05-19 DIAGNOSIS — J988 Other specified respiratory disorders: Secondary | ICD-10-CM

## 2023-05-19 MED ORDER — MONTELUKAST SODIUM 10 MG PO TABS: 10.0000 mg | ORAL_TABLET | Freq: Every day | ORAL | 5 refills | Status: AC

## 2023-05-19 MED ORDER — FLUTICASONE PROPIONATE 50 MCG/ACT NA SUSP: NASAL | 5 refills | Status: AC

## 2023-05-19 MED ORDER — METHYLPREDNISOLONE ACETATE 80 MG/ML IJ SUSP
80.0000 mg | Freq: Once | INTRAMUSCULAR | Status: AC
  Administered 2023-05-19: 80 mg via INTRAMUSCULAR

## 2023-05-19 MED ORDER — OMEPRAZOLE 40 MG PO CPDR: DELAYED_RELEASE_CAPSULE | ORAL | 5 refills | Status: AC

## 2023-05-19 NOTE — Progress Notes (Unsigned)
 [place] - [place] - [place] - [place] - [place]   Follow-up Note  Referring Provider: Ralene Ok, MD Primary Provider: Ralene Ok, MD Date of Office Visit: 05/19/2023  Subjective:   Terry Steele (DOB: February 14, 1966) is a 58 y.o. male who [facility] 05[facility]23 in re-evaluation of the following:  HPI: Terry Steele presents to this clinic in reevaluation of allergic rhinoconjunctivitis.  I last saw him in this clinic 29 January 2022.  He has really done well since his last visit and occasionally has a flareup of his respiratory tract issues for which he will occasionally use some Zyrtec.  It does not sound as though he is required a systemic steroid or antibiotic for any type of airway issue.  However, about mid January he developed stuffy head and ear ringing and ear popping teeth discomfort and some postnasal drip and throat clearing without any anosmia or ugly nasal discharge or fever or other respiratory tract symptoms for which she took a Zyrtec and Mucinex which has not really helped him very much.  He did receive a systemic steroid in September 2024 for a back issue.  But otherwise he is had no exposure to systemic steroids and a prolonged period in time.  Allergies as of 05/19/2023       Reactions   Doxycycline Itching, Rash        Medication List    diclofenac 75 MG EC tablet Commonly known as: VOLTAREN Take 75 mg by mouth 2 (two) times daily.   famotidine 40 MG tablet Commonly known as: PEPCID Take 1 tablet (40 mg total) by mouth every evening.   Fish Oil 1200 MG Caps Take 1,200 mg by mouth daily.   Lumigan 0.01 % Soln Generic drug: bimatoprost PLACE 1 DROP INTO BOTH EYES NIGHTLY.   meloxicam 15 MG tablet Commonly known as: MOBIC Take by mouth.   methocarbamol 500 MG tablet Commonly known as: ROBAXIN Take by mouth.   montelukast 10 MG tablet Commonly known as: SINGULAIR Take 1 tablet (10 mg total) by mouth at  bedtime.   MULTIVITAMIN PO Take 1 tablet by mouth daily.   omeprazole 40 MG capsule Commonly known as: PRILOSEC Take one capsule once daily   timolol 0.5 % ophthalmic solution Commonly known as: TIMOPTIC Place 1 drop into both eyes 2 times daily.    Past Medical History:  Diagnosis Date   Arthritis    back   Dysrhythmia    GERD (gastroesophageal reflux disease)    Seasonal allergies    Shortness of breath    with allergies    Past Surgical History:  Procedure Laterality Date   COLONOSCOPY W/ POLYPECTOMY     KNEE ARTHROSCOPY Right    LIGAMENT REPAIR Left    thumb   LUMBAR LAMINECTOMY  2012    Review of systems negative except as noted in HPI / PMHx or noted below:  Review of Systems  Constitutional: Negative.   HENT: Negative.    Eyes: Negative.   Respiratory: Negative.    Cardiovascular: Negative.   Gastrointestinal: Negative.   Genitourinary: Negative.   Musculoskeletal: Negative.   Skin: Negative.   Neurological: Negative.   Endo/Heme/Allergies: Negative.   Psychiatric/Behavioral: Negative.       Objective:   Vitals:   05/19/23 1543  BP: (!) 150/90  Pulse: (!) 106  Resp: 18  SpO2: 95%          Physical Exam Constitutional:  Appearance: He is not diaphoretic.  HENT:     Head: Normocephalic.     Right Ear: Ear canal and external ear normal. A middle ear effusion is present.     Left Ear: Ear canal and external ear normal. A middle ear effusion is present.     Nose: Nose normal. No mucosal edema or rhinorrhea.     Mouth/Throat:     Pharynx: Uvula midline. No oropharyngeal exudate.  Eyes:     Conjunctiva/sclera: Conjunctivae normal.  Neck:     Thyroid: No thyromegaly.     Trachea: Trachea normal. No tracheal tenderness or tracheal deviation.  Cardiovascular:     Rate and Rhythm: Normal rate and regular rhythm.     Heart sounds: Normal heart sounds, S1 normal and S2 normal. No murmur heard. Pulmonary:     Effort: No respiratory  distress.     Breath sounds: Normal breath sounds. No stridor. No wheezing or rales.  Lymphadenopathy:     Head:     Right side of head: No tonsillar adenopathy.     Left side of head: No tonsillar adenopathy.     Cervical: No cervical adenopathy.  Skin:    Findings: No erythema or rash.     Nails: There is no clubbing.  Neurological:     Mental Status: He is alert.     Diagnostics:   Assessment and Plan:   1. Respiratory tract infection   2. Dysfunction of both eustachian tubes   3. Perennial allergic rhinitis   4. LPRD (laryngopharyngeal reflux disease)    1.  For this event:   A.  Montelukast 10 mg - 1 tablet 1 time per day  B.  Nasacort or Flonase - 1 spray each nostril 1 time per day  C.  Omeprazole 40 mg - 1 tablet 1 time per day  D.  Nasal saline a few times per day  E.  Depomedrol 80 IM delivered in clinic today  2.  If needed:   A.  OTC antihistamine  B.  OTC Mucinex DM  3.  Further evaluation and treatment???  4. Influenza = Tamiflu. Covid = Paxlovid  Terry Steele is going to use a collection of anti-inflammatory agents for airway including the use of a systemic steroid to help clear out what appears to be mucosal inflammation involving his upper airway and eustachian tubes.  He will keep in contact with me noting his response to this approach.  Further evaluation treatment will based on his response.  Laurette Schimke, MD Allergy / Immunology [facility]

## 2023-05-19 NOTE — Patient Instructions (Addendum)
  1.  For this event:   A.  Montelukast 10 mg - 1 tablet 1 time per day  B.  Nasacort or Flonase - 1 spray each nostril 1 time per day  C.  Omeprazole 40 mg - 1 tablet 1 time per day  D.  Nasal saline a few times per day  E.  Depomedrol 80 IM delivered in clinic today  2.  If needed:   A.  OTC antihistamine  B.  OTC Mucinex DM  3.  Further evaluation and treatment???  4. Influenza = Tamiflu. Covid = Paxlovid

## 2023-05-20 ENCOUNTER — Encounter: Payer: Self-pay | Admitting: Allergy and Immunology

## 2023-12-30 ENCOUNTER — Ambulatory Visit: Payer: Self-pay | Attending: Cardiology | Admitting: Cardiology

## 2023-12-30 ENCOUNTER — Encounter: Payer: Self-pay | Admitting: Cardiology

## 2023-12-30 ENCOUNTER — Other Ambulatory Visit: Payer: Self-pay

## 2023-12-30 VITALS — BP 146/92 | HR 87 | Ht 74.0 in | Wt 236.4 lb

## 2023-12-30 DIAGNOSIS — R072 Precordial pain: Secondary | ICD-10-CM

## 2023-12-30 DIAGNOSIS — R9431 Abnormal electrocardiogram [ECG] [EKG]: Secondary | ICD-10-CM | POA: Diagnosis not present

## 2023-12-30 DIAGNOSIS — Z79899 Other long term (current) drug therapy: Secondary | ICD-10-CM

## 2023-12-30 DIAGNOSIS — I739 Peripheral vascular disease, unspecified: Secondary | ICD-10-CM

## 2023-12-30 DIAGNOSIS — R030 Elevated blood-pressure reading, without diagnosis of hypertension: Secondary | ICD-10-CM

## 2023-12-30 NOTE — Progress Notes (Signed)
 Cardiology Office Note:  .   Date:  12/30/2023  ID:  Terry Steele, DOB 1965/05/19, MRN [id number] PCP: Valma Carwin, MD  [facility] Providers Cardiologist:  None   History of Present Illness: .   Terry Steele is a 58 y.o. African-American male patient referred to me for evaluation of chest pain, abnormal EKG on 04/18/2022 revealing inferolateral T wave inversions and pedal edema that started about 6 months ago.  Patient has chronic low back pain and advanced lumbar disc disease, reactive airway disease with bronchial asthma.  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  Since he was having precordial pain in the form of heartburn he was advised not to do physical exertion and hence over the past 3 to 6 months he has not been very active but able to do usual activities of daily living and his business without any limitations.  He is now having chest discomfort in the form of heartburn mostly at home or at rest or after meals and has been taking therapy for GERD on a as needed basis.  States that he had back issues and neck issues and with activity he has noticed he has to stop due to right hip discomfort and leg pain and weakness.  Left leg also hurts but not as bad.  Cardiac Studies relevent.      Discussed the use of AI scribe software for clinical note transcription with the patient, who gave verbal consent to proceed.  History of Present Illness Terry Steele is a 58 year old male who presents with chest discomfort and abnormal EKG findings. He was referred by Dr. Gailen for further evaluation after an abnormal EKG.  He experiences chest discomfort for the past six months, described as a sensation of needing to burp. The discomfort occurs sporadically, sometimes after eating, and is not consistently related to physical activity. He takes medication for acid reflux as needed.  His physical activity is limited to walking. He  last engaged in more intense exercise about three to four months ago. Recently, he climbed three flights of stairs without experiencing chest discomfort.  He has a family history of heart disease; his father underwent bypass surgery in his fifties. He does not smoke.  His blood pressure typically runs around 141/78 at home, but it was elevated at 146/92 during today's visit. He recalls a previous instance of high blood pressure in February of this year.   Labs    Care everywhere/Faxed External Labs:  Labs 09/17/2023:  Serum glucose 81 mg, BUN 13, creatinine 1.10, eGFR 77 mL, potassium 4.2, LFTs normal.  Hb 15.1/HCT 45.7, platelets 253.  Troponin I 9, BNP 15.  ROS  Review of Systems  Cardiovascular:  Positive for chest pain and claudication (right hip). Negative for dyspnea on exertion and leg swelling.   Physical Exam:   VS:  BP (!) 146/92   Pulse 87   Ht 6' 2 (1.88 m)   Wt 236 lb 6.4 oz (107.2 kg)   SpO2 97%   BMI 30.35 kg/m    Wt Readings from Last 3 Encounters:  12/30/23 236 lb 6.4 oz (107.2 kg)  01/29/22 239 lb 6.4 oz (108.6 kg)  07/18/18 240 lb 9.6 oz (109.1 kg)    BP Readings from Last 3 Encounters:  12/30/23 (!) 146/92  05/19/23 (!) 150/90  01/29/22 (!) 150/88   Physical Exam Constitutional:  Appearance: He is obese.  Neck:     Vascular: No JVD.  Cardiovascular:     Rate and Rhythm: Normal rate and regular rhythm.     Pulses: Intact distal pulses.          Carotid pulses are 2+ on the right side and 2+ on the left side.      Femoral pulses are 1+ on the right side and 2+ on the left side.      Popliteal pulses are 0 on the right side.       Dorsalis pedis pulses are 0 on the right side and 2+ on the left side.       Posterior tibial pulses are 0 on the right side and 1+ on the left side.     Heart sounds: Normal heart sounds. No murmur heard.    No gallop.  Pulmonary:     Effort: Pulmonary effort is normal.     Breath sounds: Normal breath sounds.   Abdominal:     General: Bowel sounds are normal.     Palpations: Abdomen is soft.  Musculoskeletal:     Right lower leg: No edema.     Left lower leg: No edema.    EKG:    EKG Interpretation Date/Time:  Thursday December 30 2023 09:05:43 EDT Ventricular Rate:  83 PR Interval:  192 QRS Duration:  90 QT Interval:  344 QTC Calculation: 404 R Axis:   3  Text Interpretation: EKG 12/30/2023: Normal sinus rhythm with rate of 83 bpm, normal axis.  Early repolarization.  Poor R progression, cannot exclude anteroseptal infarct old.  Low-voltage complexes.  T wave abnormality, inferolateral ischemia.   No significant change from 04/19/2023 PCP EKG. Confirmed by Cambri Plourde, Jagadeesh [phone]) on 12/30/2023 9:13:21 AM  PCP EKG 04/19/2023: Normal sinus rhythm at rate of 64 bpm, leftward axis, nonspecific inferior and lateral T wave inversion, cannot exclude ischemia.  ASSESSMENT AND PLAN: .      ICD-10-CM   1. Abnormal EKG  R94.31 EKG 12-Lead    Cardiac Stress Test: Informed Consent Details: Physician/Practitioner Attestation; Transcribe to consent form and obtain patient signature    ECHOCARDIOGRAM COMPLETE    Myocardial Perfusion Imaging    2. Precordial pain  R07.2 EKG 12-Lead    Cardiac Stress Test: Informed Consent Details: Physician/Practitioner Attestation; Transcribe to consent form and obtain patient signature    ECHOCARDIOGRAM COMPLETE    Myocardial Perfusion Imaging    3. Claudication in peripheral vascular disease  I73.9 VAS US  LOWER EXTREMITY ARTERIAL DUPLEX    VAS US  ABI WITH/WO TBI    4. Elevated BP without diagnosis of hypertension  R03.0       Assessment and Plan Assessment & Plan Precordial chest pain with abnormal electrocardiogram Intermittent precordial chest pain for six months, described as a burning sensation, not clearly related to physical activity. Abnormal EKG suggests potential coronary artery disease. Differential includes cardiac etiology versus gastroesophageal  reflux disease. - Order exercise nuclear stress test and echocardiogram to assess for significant coronary artery disease and evaluate heart function.  Claudication involving right hip and back Right hip and back pain with circulation issues noted on exam. Claudication suspected, possibly due to peripheral arterial disease. Further vascular assessment is necessary. - Order lower extremity arterial duplex, abdominal aortic duplex, and ankle-brachial index (ABI).  Hypertension Blood pressure recorded at 146/92 mmHg today, consistent with previous readings. Home measurements reportedly lower, but potential hypertension needs further evaluation. Stress test will help assess hypertensive response  to exercise. - Monitor blood pressure regularly. - Verify home blood pressure monitor accuracy at pharmacy. - Evaluate blood pressure response during stress test.  Obesity Body mass index of 30, classified as mildly obese. Weight loss recommended to improve overall cardiovascular health. - Advise weight loss of 15-20 pounds. - Encourage dietary modifications to reduce caloric intake. - Recommend increased physical activity, focusing on walking and cardiovascular exercises.  Gastroesophageal reflux disease (GERD) Intermittent burning sensation in chest possibly related to GERD. Symptoms occur after eating and are relieved by medication. Differential diagnosis includes cardiac etiology, which is being evaluated.   Follow up: 3 months  Signed,  Gordy Bergamo, MD, [facility] 12/30/2023, 8:25 PM [facility] [address] [place], [place] [address] Phone: [phone]. Fax:  [phone]

## 2023-12-30 NOTE — Patient Instructions (Signed)
 Medication Instructions:  Your physician recommends that you continue on your current medications as directed. Please refer to the Current Medication list given to you today.  *If you need a refill on your cardiac medications before your next appointment, please call your pharmacy*  Lab Work: Please complete an LP(a) and a Fasting lipid panel in our first floor lab today before you leave.  If you have labs (blood work) drawn today and your tests are completely normal, you will receive your results only by: MyChart Message (if you have MyChart) OR A paper copy in the mail If you have any lab test that is abnormal or we need to change your treatment, we will call you to review the results.  Testing/Procedures: Your physician has requested that you have an ankle brachial index (ABI). During this test an ultrasound and blood pressure cuff are used to evaluate the arteries that supply the arms and legs with blood. Allow thirty minutes for this exam. There are no restrictions or special instructions.  Please note: We ask at that you not bring children with you during ultrasound (echo/ vascular) testing. Due to room size and safety concerns, children are not allowed in the ultrasound rooms during exams. Our front office staff cannot provide observation of children in our lobby area while testing is being conducted. An adult accompanying a patient to their appointment will only be allowed in the ultrasound room at the discretion of the ultrasound technician under special circumstances. We apologize for any inconvenience.  Your physician has requested that you have an echocardiogram. Echocardiography is a painless test that uses sound waves to create images of your heart. It provides your doctor with information about the size and shape of your heart and how well your heart's chambers and valves are working. This procedure takes approximately one hour. There are no restrictions for this procedure. Please  do NOT wear cologne, perfume, aftershave, or lotions (deodorant is allowed). Please arrive 15 minutes prior to your appointment time.  Please note: We ask at that you not bring children with you during ultrasound (echo/ vascular) testing. Due to room size and safety concerns, children are not allowed in the ultrasound rooms during exams. Our front office staff cannot provide observation of children in our lobby area while testing is being conducted. An adult accompanying a patient to their appointment will only be allowed in the ultrasound room at the discretion of the ultrasound technician under special circumstances. We apologize for any inconvenience.  Your physician has requested that you have en exercise stress myoview. For further information please visit [email or web address] Please follow instruction sheet, as given.   Follow-Up: At [facility], you and your health needs are our priority.  As part of our continuing mission to provide you with exceptional heart care, our providers are all part of one team.  This team includes your primary Cardiologist (physician) and Advanced Practice Providers or APPs (Physician Assistants and Nurse Practitioners) who all work together to provide you with the care you need, when you need it.  Your next appointment:   6 week(s)  Provider:   Dr. DOROTHA Bergamo

## 2024-01-01 LAB — LIPOPROTEIN A (LPA): Lipoprotein (a): 39 nmol/L (ref <75.0)

## 2024-01-27 ENCOUNTER — Telehealth (HOSPITAL_COMMUNITY): Payer: Self-pay | Admitting: *Deleted

## 2024-01-27 NOTE — Telephone Encounter (Signed)
 Left a detailed on voice mail with instructions regarding a STRESS STUDY scheduled for 01/31/24 at 7:45.

## 2024-01-28 ENCOUNTER — Other Ambulatory Visit: Payer: Self-pay | Admitting: Cardiology

## 2024-01-28 ENCOUNTER — Ambulatory Visit (HOSPITAL_COMMUNITY)
Admission: RE | Admit: 2024-01-28 | Discharge: 2024-01-28 | Disposition: A | Discharge status: Home / Self Care | Source: Ambulatory Visit | Attending: Cardiology | Admitting: Cardiology

## 2024-01-28 ENCOUNTER — Encounter (HOSPITAL_COMMUNITY): Payer: Self-pay

## 2024-01-28 ENCOUNTER — Ambulatory Visit (HOSPITAL_BASED_OUTPATIENT_CLINIC_OR_DEPARTMENT_OTHER)
Admission: RE | Admit: 2024-01-28 | Discharge: 2024-01-28 | Disposition: A | Discharge status: Home / Self Care | Source: Ambulatory Visit | Attending: Cardiology | Admitting: Cardiology

## 2024-01-28 DIAGNOSIS — I739 Peripheral vascular disease, unspecified: Secondary | ICD-10-CM | POA: Diagnosis present

## 2024-01-28 DIAGNOSIS — R072 Precordial pain: Secondary | ICD-10-CM

## 2024-01-28 DIAGNOSIS — R9431 Abnormal electrocardiogram [ECG] [EKG]: Secondary | ICD-10-CM

## 2024-01-29 LAB — VAS US ABI WITH/WO TBI

## 2024-01-31 ENCOUNTER — Telehealth: Payer: Self-pay | Admitting: Cardiology

## 2024-01-31 ENCOUNTER — Ambulatory Visit (HOSPITAL_COMMUNITY)
Admission: RE | Admit: 2024-01-31 | Discharge: 2024-01-31 | Disposition: A | Discharge status: Home / Self Care | Source: Ambulatory Visit | Attending: Cardiovascular Disease | Admitting: Cardiovascular Disease

## 2024-01-31 ENCOUNTER — Ambulatory Visit: Payer: Self-pay | Admitting: Cardiology

## 2024-01-31 DIAGNOSIS — R9431 Abnormal electrocardiogram [ECG] [EKG]: Secondary | ICD-10-CM | POA: Insufficient documentation

## 2024-01-31 DIAGNOSIS — R072 Precordial pain: Secondary | ICD-10-CM | POA: Insufficient documentation

## 2024-01-31 LAB — MYOCARDIAL PERFUSION IMAGING
Angina Index: 0
Duke Treadmill Score: 9
Estimated workload: 10.1
Exercise duration (min): 9 min
Exercise duration (sec): 0 s
LV dias vol: 106 mL (ref 62–150)
LV sys vol: 29 mL (ref 4.2–5.8)
MPHR: 161 {beats}/min
Nuc Stress EF: 73 %
Peak HR: 141 {beats}/min
Percent HR: 88 %
RPE: 19
Rest HR: 76 {beats}/min
Rest Nuclear Isotope Dose: 10 mCi
SDS: 0
SSS: 0
ST Depression (mm): 0 mm
Stress Nuclear Isotope Dose: 32.3 mCi
TID: 0.95

## 2024-01-31 MED ORDER — TECHNETIUM TC 99M TETROFOSMIN IV KIT
32.3000 | PACK | Freq: Once | INTRAVENOUS | Status: AC | PRN
  Administered 2024-01-31: 32.3 via INTRAVENOUS

## 2024-01-31 MED ORDER — TECHNETIUM TC 99M TETROFOSMIN IV KIT
10.0000 | PACK | Freq: Once | INTRAVENOUS | Status: AC | PRN
  Administered 2024-01-31: 10 via INTRAVENOUS

## 2024-01-31 NOTE — Progress Notes (Signed)
 Stress test is essentially normal with normal blood pressure and exercise tolerance.

## 2024-01-31 NOTE — Telephone Encounter (Signed)
 Patient states that he received a call this morning regarding a test. I dont see where anyone has called him but it does look like Dr. Ladona read his ABI and not sure if it is in regards to that. Please advise.

## 2024-01-31 NOTE — Progress Notes (Signed)
 Normal ABI and suggests good circulation to the legs

## 2024-01-31 NOTE — Telephone Encounter (Signed)
 I called the patient was able to let him know of his normal ABI results. He did not have any concerns but had a question about his upcoming Echo which I was able to clarify.

## 2024-02-11 ENCOUNTER — Ambulatory Visit (HOSPITAL_COMMUNITY)

## 2024-02-11 ENCOUNTER — Ambulatory Visit (HOSPITAL_COMMUNITY)
Admission: RE | Admit: 2024-02-11 | Discharge: 2024-02-11 | Disposition: A | Discharge status: Home / Self Care | Source: Ambulatory Visit | Attending: Cardiology | Admitting: Cardiology

## 2024-02-11 DIAGNOSIS — R9431 Abnormal electrocardiogram [ECG] [EKG]: Secondary | ICD-10-CM | POA: Diagnosis present

## 2024-02-11 DIAGNOSIS — R072 Precordial pain: Secondary | ICD-10-CM | POA: Insufficient documentation

## 2024-02-11 LAB — ECHOCARDIOGRAM COMPLETE
Area-P 1/2: 3.74 cm2
S' Lateral: 2.93 cm

## 2024-02-11 MED ORDER — PERFLUTREN LIPID MICROSPHERE
1.0000 mL | INTRAVENOUS | Status: AC | PRN
  Administered 2024-02-11: 2 mL via INTRAVENOUS

## 2024-02-12 NOTE — Progress Notes (Signed)
 Essentially normal echocardiogram.  Please encourage him to sign up for MyChart.

## 2024-02-16 ENCOUNTER — Encounter: Payer: Self-pay | Admitting: Cardiology

## 2024-02-16 ENCOUNTER — Ambulatory Visit: Attending: Cardiology | Admitting: Cardiology

## 2024-02-16 VITALS — BP 165/92 | HR 69 | Resp 16 | Ht 74.0 in | Wt 236.4 lb

## 2024-02-16 DIAGNOSIS — I10 Essential (primary) hypertension: Secondary | ICD-10-CM

## 2024-02-16 DIAGNOSIS — I2510 Atherosclerotic heart disease of native coronary artery without angina pectoris: Secondary | ICD-10-CM

## 2024-02-16 MED ORDER — ATORVASTATIN CALCIUM 20 MG PO TABS: 20.0000 mg | ORAL_TABLET | Freq: Every day | ORAL | 0 refills | Status: DC

## 2024-02-16 MED ORDER — OLMESARTAN MEDOXOMIL-HCTZ 20-12.5 MG PO TABS: 1.0000 | ORAL_TABLET | ORAL | 0 refills | Status: DC

## 2024-02-16 NOTE — Progress Notes (Signed)
 Cardiology Office Note:  .   Date:  02/18/2024  ID:  Terry Steele, DOB 02/06/1966, MRN [id number] PCP: Valma Carwin, MD  [facility] Providers Cardiologist:  Gordy Bergamo, MD   History of Present Illness: .   Terry Steele is a 58 y.o. African-American male patient referred to me for evaluation of chest pain in the form of heartburn with exertion, abnormal EKG on 04/18/2022 revealing inferolateral T wave inversions and pedal edema that started about 6 months ago.  Patient has chronic low back pain and advanced lumbar disc disease, reactive airway disease with bronchial asthma.  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  I had evaluated him on 12/30/2023 and recommended stress testing, echocardiogram and in view of symptoms suggestive of claudication, ABI.  He now presents for follow-up.  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  History of Present Illness Terry Steele is a 58 year old male who presents for management of high blood pressure.  He has persistently elevated blood pressure. A recent stress test showed 156/103 mmHg. At home, readings average about 135/69 mmHg, but during the day at work his diastolic can reach 90 mmHg. He is not taking antihypertensive medication.  He has intermittent ankle swelling, which he notices with certain foods and frequent travel.  At a recent physical, his cholesterol was 102 mg/dL. He is planning to increase exercise and improve his diet.  He is worried about a previously mentioned possible blockage and understands more testing is needed. He also has chest pain that he thinks may be from gas or acid reflux, and prior testing reportedly did not show a cardiac  cause.   Cardiac Studies relevent.    MYOCARDIAL PERFUSION IMAGING 01/31/2024    Findings are consistent with no ischemia and no infarction. The study is overall low risk. Small mild perfusion defect at apex that is fixed with normal wall motion suggests artifact. LVEF 73%   Coronary calcium  was present on the attenuation correction CT images in the LAD.   A Bruce protocol stress test was performed. Exercise capacity was excellent. Patient exercised for 9 min and 0 sec. Maximum HR of 141 bpm. MPHR 88.0%. Peak METS 10.1. The patient experienced no angina during the test. The test was stopped because the patient experienced fatigue and dyspnea. The patient reported dyspnea and fatigue during the stress test. Onset of symptoms occurred at stage 3 of the protocol. Symptoms began at minute 7:30 during stress and ended at minute 2 during recovery. Elevated blood pressure without frank hypertensive response, and normal heart rate response noted during stress. Heart rate recovery was normal.  ECHOCARDIOGRAM COMPLETE 02/11/2024  1. Left ventricular ejection fraction, by estimation, is 60 to 65%. The left ventricle has normal function. The left ventricle has no regional wall motion abnormalities. Left ventricular diastolic parameters were normal. The average left ventricular global longitudinal strain is -21.0 %. The global longitudinal strain is normal. 2. Right ventricular systolic function is normal. The right ventricular size is normal. 3. The mitral valve is normal in structure. No evidence of mitral valve regurgitation. No evidence of mitral stenosis. 4. The aortic valve is normal in structure. There is mild calcification of the aortic  valve. There is mild thickening of the aortic valve. Aortic valve regurgitation is not visualized. Aortic valve sclerosis/calcification is present, without any evidence of aortic stenosis. 5. The inferior vena cava is normal in size with greater than 50% respiratory  variability, suggesting right atrial pressure of 3 mmHg.  ABI 12/28/2023:   Triphasic waveforms bilateral lower extremity and noncompressible.  Normal TBI.  Labs   No results found for: CHOL, HDL, LDLCALC, LDLDIRECT, TRIG, CHOLHDL Lipoprotein (a)  Date/Time Value Ref Range Status  12/31/2023 08:12 AM 39.0 <75.0 nmol/L Final    Comment:    Note:  Values greater than or equal to 75.0 nmol/L may        indicate an independent risk factor for CHD,        but must be evaluated with caution when applied        to non-Caucasian populations due to the        influence of genetic factors on Lp(a) across        ethnicities.     No results for input(s): NA, K, CL, CO2, GLUCOSE, BUN, CREATININE, CALCIUM , GFRNONAA, GFRAA in the last 8760 hours.  Lab Results  Component Value Date   ALT 42 09/19/2013   AST 29 09/19/2013   ALKPHOS 45 09/19/2013   BILITOT 0.4 09/19/2013      Latest Ref Rng & Units 09/19/2013    2:03 AM 09/07/2013    9:04 AM  CBC  WBC 4.0 - 10.5 K/uL 13.0  5.0   Hemoglobin 13.0 - 17.0 g/dL 86.3  84.2   Hematocrit 39.0 - 52.0 % 41.0  45.1   Platelets 150 - 400 K/uL 219  239    No results found for: HGBA1C  No results found for: TSH  Care everywhere/Faxed External Labs:  Labs 09/17/2023:  Serum glucose 81 mg, BUN 13, creatinine 1.10, eGFR 77 mL, potassium 4.2, LFTs normal.  Hb 15.1/HCT 45.7, platelets 253.  Troponin I 9, BNP 15.  ROS  Review of Systems  Cardiovascular:  Negative for chest pain, dyspnea on exertion and leg swelling.   Physical Exam:   VS:  BP (!) 165/92 (BP Location: Left Arm, Patient Position: Sitting, Cuff Size: Large)   Pulse 69   Resp 16   Ht 6' 2 (1.88 m)   Wt 236 lb 6.4 oz (107.2 kg)   SpO2 98%   BMI 30.35 kg/m    Wt Readings from Last 3 Encounters:  02/16/24 236 lb 6.4 oz (107.2 kg)  12/30/23 236 lb 6.4 oz (107.2 kg)  01/29/22 239 lb 6.4 oz (108.6 kg)    BP Readings from Last 3 Encounters:   02/16/24 (!) 165/92  12/30/23 (!) 146/92  05/19/23 (!) 150/90   Physical Exam Neck:     Vascular: No carotid bruit or JVD.  Cardiovascular:     Rate and Rhythm: Normal rate and regular rhythm.     Pulses: Intact distal pulses.     Heart sounds: Normal heart sounds. No murmur heard.    No gallop.  Pulmonary:     Effort: Pulmonary effort is normal.     Breath sounds: Normal breath sounds.  Abdominal:     General: Bowel sounds are normal.     Palpations: Abdomen is soft.  Musculoskeletal:     Right lower leg: No edema.     Left lower leg: No edema.    EKG:       EKG 12/30/2023: Normal sinus rhythm with rate of 83 bpm,  normal axis.  Early repolarization.  Poor R progression, cannot exclude anteroseptal infarct old.  Low-voltage complexes.  T wave abnormality, inferolateral ischemia.   No significant change from 04/19/2023 PCP EKG.  ASSESSMENT AND PLAN: .      ICD-10-CM   1. Primary hypertension  I10 olmesartan -hydrochlorothiazide (BENICAR  HCT) 20-12.5 MG tablet    Basic Metabolic Panel (BMET)    2. Coronary artery calcification seen on CAT scan  I25.10 atorvastatin  (LIPITOR) 20 MG tablet     Assessment & Plan Essential hypertension with atherosclerotic heart disease and aortic valve calcification Hypertension is persistently elevated with a recent stress test showing a blood pressure of 156/103 mmHg. EKG changes indicate long-standing hypertension affecting the heart, including early repolarization, antiseptic infarction, low voltage complexes, TV abnormality, and ischemia. Aortic valve calcification suggests plaque buildup. The condition is contributing to atherosclerotic heart disease. Lifestyle modifications alone are insufficient, necessitating pharmacological intervention to prevent further cardiac damage. - Prescribed olmesartan  HCT 20/12.5 mg once daily in the morning. BMP in 2-3 weeks - Advised follow-up with primary care physician, Dr. Morera for follow up of hypertension  and management of hyperchol with goal LDL < 55 - Educated on lifestyle modifications, including exercise and weight loss, to aid in blood pressure control. - Patient initially in denial regarding existence of hypertension and coronary atherosclerosis reported appreciated of CAD, make lifestyle strides.  Hyperlipidemia Cholesterol levels are elevated, contributing to atherosclerotic heart disease. Stress test and echocardiogram indicate plaque buildup in the coronary arteries and aortic valve calcification. Pharmacological intervention is necessary to stabilize plaque and prevent further cardiovascular events. I do not have baseline lipid values, patient recently had blood work - Prescribed Lipitor 20 mg once daily. - Advised follow-up with Dr. Mindy in 6-8 weeks. - Instructed to repeat cholesterol   Lower extremity edema Edema is likely multifactorial, related to dietary habits, prolonged travel, and possibly venous insufficiency. Circulation in the legs is adequate, with palpable pulses. - Recommended wearing support socks to reduce leg swelling. - Advised on dietary modifications to reduce salt intake.  Follow up: PRN Signed,  Gordy Bergamo, MD, [facility] 02/18/2024, 9:48 AM [facility] [address] [place], [place] [address] Phone: [phone]. Fax:  [phone]

## 2024-02-16 NOTE — Patient Instructions (Signed)
 Medication Instructions:  Start Atorvastatin  20 mg by mouth daily Start Olmesartan  hydrochlorothiazide 20/12.5 mg by mouth daily   *If you need a refill on your cardiac medications before your next appointment, please call your pharmacy*  Lab Work: Have lab work drawn in about 2 weeks. BMP  Can be done at any [facility] location.  There is a [facility] on the first floor in our building If you have labs (blood work) drawn today and your tests are completely normal, you will receive your results only by: MyChart Message (if you have MyChart) OR A paper copy in the mail If you have any lab test that is abnormal or we need to change your treatment, we will call you to review the results.  Testing/Procedures: none  Follow-Up: At [facility], you and your health needs are our priority.  As part of our continuing mission to provide you with exceptional heart care, our providers are all part of one team.  This team includes your primary Cardiologist (physician) and Advanced Practice Providers or APPs (Physician Assistants and Nurse Practitioners) who all work together to provide you with the care you need, when you need it.  Your next appointment:   As needed  Provider:   Gordy Bergamo, MD    We recommend signing up for the patient portal called MyChart.  Sign up information is provided on this After Visit Summary.  MyChart is used to connect with patients for Virtual Visits (Telemedicine).  Patients are able to view lab/test results, encounter notes, upcoming appointments, etc.  Non-urgent messages can be sent to your provider as well.   To learn more about what you can do with MyChart, go to [email or web address].   Other Instructions

## 2024-02-25 ENCOUNTER — Ambulatory Visit: Admitting: Cardiology

## 2024-03-10 ENCOUNTER — Telehealth: Payer: Self-pay | Admitting: Cardiology

## 2024-03-10 LAB — BASIC METABOLIC PANEL WITH GFR
BUN/Creatinine Ratio: 13 (ref 9–20)
BUN: 16 mg/dL (ref 6–24)
CO2: 25 mmol/L (ref 20–29)
Calcium: 9.5 mg/dL (ref 8.7–10.2)
Chloride: 103 mmol/L (ref 96–106)
Creatinine, Ser: 1.2 mg/dL (ref 0.76–1.27)
Glucose: 88 mg/dL (ref 70–99)
Potassium: 4.6 mmol/L (ref 3.5–5.2)
Sodium: 139 mmol/L (ref 134–144)
eGFR: 70 mL/min/1.73

## 2024-03-10 NOTE — Telephone Encounter (Signed)
 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  Pt also asking about Olmesartan -hydrochlorothiazide 20 (prescribed by Dr. Ladona) - says he takes Diclofenac SOD EC for his back, but his pharmacy suggested not to take those together as if effects BP. Pt asking if another med should be prescribed.  Pt can be contacted at: [phone]  If patient is requesting to be seen today, or if patient is having symptoms:  What symptoms are being reported (if any)?  N/A  Route to triage pool and ensure Teams message has been sent to the Triage Walk-In chat.  3.   For medication samples, medication refills, HIM requests, appointment requests, lab-related requests, or form/record drop-off, please route to the appropriate pool.

## 2024-03-10 NOTE — Telephone Encounter (Signed)
 Patient had walked in earlier today while having labs drawn. Called and spoke to pt. He states that his pharmacy advised him to get in touch with Cardiology team regarding possibly s/e r/t New MED Benicar  and Diclofenac (has been on this one for years due to 2 Hip Replacements and 2 Back Surgeries, per pt). He has been taking the Benicar  and stopped the Diclofenac and now is worried about Atorvastatin  (new med) causing body aches. Every day he has body aches all over and is needing to know if he can take something OTC for the aches.   His other concern is r/t Echo results and under Findings: Aneurysm. And that he may need a Cardiac MRI.   Per Dr. Ladona: Essentially normal echocardiogram. Please encourage him to sign up for MyChart.   Advised pt that our Pharmacy Team [facility]) will call him to review Medication questions. Also advised him that the Echo was WNL and NOT to worry about the other section in the full report. He verbalized understanding. He had labs drawn today and plans to f/u with his PCP in about two weeks. He was very appreciative of the call.

## 2024-03-10 NOTE — Telephone Encounter (Signed)
 Patient expressed concern about a potential interaction between diclofenac and olmesartan /hydrochlorothiazide as noted by his pharmacist at pickup. I called to assess further and learned that the patient has been taking diclofenac chronically for back pain as prescribed by orthopedics. He recently started olmesartan /HCTZ a couple of weeks ago. We discussed that this combination may impact blood pressure and kidney function, and monitoring is necessary. A BMP was ordered today 2-3 post starting ARB/thiazide.   I recommended using acetaminophen  and methocarbamol  if he has any back pain as needed and limiting diclofenac to as-needed use. We reviewed the risks of chronic NSAID use, including kidney impairment, ulcers, reduced bone health and noted that long-term use is not recommended. The patient will contact orthopedics for additional guidance. In the meantime, he will use acetaminophen  and methocarbamol  as needed, with a maximum acetaminophen  dose of 4 g/day.

## 2024-03-11 ENCOUNTER — Ambulatory Visit: Payer: Self-pay | Admitting: Cardiology

## 2024-03-11 NOTE — Progress Notes (Signed)
 Normal labs.

## 2024-03-27 ENCOUNTER — Telehealth: Payer: Self-pay | Admitting: Cardiology

## 2024-03-27 NOTE — Telephone Encounter (Signed)
 Good morning, this pt came in wanting to know if he can speak with someone about his medications. he has back pain and was advised he can not take 2 of the medications at the same time. Pt call back number is

## 2024-04-19 ENCOUNTER — Encounter: Payer: Self-pay | Admitting: Neurological Surgery

## 2024-04-20 ENCOUNTER — Other Ambulatory Visit: Payer: Self-pay | Admitting: Neurological Surgery

## 2024-04-20 DIAGNOSIS — M48062 Spinal stenosis, lumbar region with neurogenic claudication: Secondary | ICD-10-CM

## 2024-04-25 ENCOUNTER — Other Ambulatory Visit: Payer: Self-pay | Admitting: Cardiology

## 2024-04-25 DIAGNOSIS — I10 Essential (primary) hypertension: Secondary | ICD-10-CM

## 2024-04-25 DIAGNOSIS — I2510 Atherosclerotic heart disease of native coronary artery without angina pectoris: Secondary | ICD-10-CM

## 2024-05-02 ENCOUNTER — Other Ambulatory Visit
